# Patient Record
Sex: Female | Born: 1995
Health system: Southern US, Community
[De-identification: ages and names within clinical notes are randomized; demographics above are authoritative.]

## PROBLEM LIST (undated history)

## (undated) ENCOUNTER — Inpatient Hospital Stay (HOSPITAL_COMMUNITY): Payer: Self-pay

## (undated) DIAGNOSIS — Z789 Other specified health status: Secondary | ICD-10-CM

## (undated) HISTORY — PX: HERNIA REPAIR: SHX51

## (undated) HISTORY — PX: APPENDECTOMY: SHX54

---

## 2018-10-28 ENCOUNTER — Inpatient Hospital Stay (HOSPITAL_COMMUNITY)
Admission: AD | Admit: 2018-10-28 | Discharge: 2018-10-29 | Disposition: A | Discharge status: Home / Self Care | Payer: 59 | Source: Ambulatory Visit | Attending: Obstetrics and Gynecology | Admitting: Obstetrics and Gynecology

## 2018-10-28 ENCOUNTER — Encounter (HOSPITAL_COMMUNITY): Payer: Self-pay

## 2018-10-28 DIAGNOSIS — O4691 Antepartum hemorrhage, unspecified, first trimester: Secondary | ICD-10-CM | POA: Diagnosis not present

## 2018-10-28 DIAGNOSIS — O200 Threatened abortion: Secondary | ICD-10-CM | POA: Diagnosis not present

## 2018-10-28 DIAGNOSIS — Z3A01 Less than 8 weeks gestation of pregnancy: Secondary | ICD-10-CM | POA: Diagnosis not present

## 2018-10-28 DIAGNOSIS — O209 Hemorrhage in early pregnancy, unspecified: Secondary | ICD-10-CM | POA: Diagnosis present

## 2018-10-28 DIAGNOSIS — O4690 Antepartum hemorrhage, unspecified, unspecified trimester: Secondary | ICD-10-CM

## 2018-10-28 HISTORY — DX: Other specified health status: Z78.9

## 2018-10-28 LAB — ABO/RH: ABO/RH(D): O POS

## 2018-10-28 LAB — CBC
HCT: 39.5 % (ref 36.0–46.0)
Hemoglobin: 13.1 g/dL (ref 12.0–15.0)
MCH: 28.7 pg (ref 26.0–34.0)
MCHC: 33.2 g/dL (ref 30.0–36.0)
MCV: 86.4 fL (ref 80.0–100.0)
Platelets: 218 10*3/uL (ref 150–400)
RBC: 4.57 MIL/uL (ref 3.87–5.11)
RDW: 11.7 % (ref 11.5–15.5)
WBC: 7 10*3/uL (ref 4.0–10.5)
nRBC: 0 % (ref 0.0–0.2)

## 2018-10-28 NOTE — MAU Provider Note (Signed)
History     CSN: [id number]  Arrival date and time: 10/28/18 2259   First Provider Initiated Contact with Patient 10/28/18 2341      Chief Complaint  Patient presents with  . Vaginal Bleeding   Whitney Wood is a 23 y.o. G2P1 at [redacted]w[redacted]d by LMP who presents to MAU with complaints of vaginal bleeding. She reports vaginal bleeding started occurring this morning, started off as light pink spotting when she wipes then over the course of today turned into heavier bright red vaginal bleeding. She reports having to wear a panty liner for vaginal bleeding but it not being heavy enough to wear a pad. She denies abdominal pain/cramping, denies recent IC.    OB History    Gravida  2   Para  1   Term  1   Preterm  0   AB  0   Living  1     SAB  0   TAB  0   Ectopic  0   Multiple  0   Live Births  1           Past Medical History:  Diagnosis Date  . Medical history non-contributory     Past Surgical History:  Procedure Laterality Date  . APPENDECTOMY    . HERNIA REPAIR      Family History  Problem Relation Age of Onset  . Hypothyroidism Mother     Social History   Tobacco Use  . Smoking status: Never Smoker  . Smokeless tobacco: Never Used  Substance Use Topics  . Alcohol use: Never    Frequency: Never  . Drug use: Never    Allergies: No Known Allergies  No medications prior to admission.    Review of Systems  Constitutional: Negative.   Respiratory: Negative.   Cardiovascular: Negative.   Gastrointestinal: Negative.   Genitourinary: Positive for vaginal bleeding. Negative for difficulty urinating, dysuria, frequency, pelvic pain and urgency.  Musculoskeletal: Negative.    Physical Exam   Patient Vitals for the past 24 hrs:  BP Temp Temp src Pulse Resp SpO2 Height Weight  10/29/18 0105 126/70 97.8 F (36.6 C) Oral 92 18 - - -  10/28/18 2309 132/76 98.3 F (36.8 C) Oral 96 16 98 % 5\' 5"  (1.651 m) 75.3 kg    Physical Exam  Nursing note  and vitals reviewed. Constitutional: She is oriented to person, place, and time. She appears well-developed and well-nourished. No distress.  Cardiovascular: Normal rate, regular rhythm and normal heart sounds.  Respiratory: Effort normal and breath sounds normal. No respiratory distress. She has no wheezes.  GI: Soft. Bowel sounds are normal. She exhibits no distension. There is no abdominal tenderness. There is no rebound and no guarding.  Genitourinary:    Vaginal bleeding present.  There is bleeding in the vagina.    Genitourinary Comments: Pelvic exam: Cervix pink, visually closed, without lesion, moderate amount of bright red vaginal bleeding without clots (3 faux swabs), vaginal walls and external genitalia normal Bimanual exam: Cervix 0/long/high, firm, anterior, neg CMT, uterus nontender, nonenlarged, adnexa without tenderness, enlargement, or mass   Musculoskeletal: Normal range of motion.        General: No edema.  Neurological: She is alert and oriented to person, place, and time.  Psychiatric: She has a normal mood and affect. Her behavior is normal. Thought content normal.    MAU Course  Procedures  MDM Orders Placed This Encounter  Procedures  . Wet prep, genital  . US  OB LESS THAN 14 WEEKS WITH OB TRANSVAGINAL  . Urinalysis, Routine w reflex microscopic  . CBC  . hCG, quantitative, pregnancy  . ABO/Rh   Labs and [place] report reviewed:  Results for orders placed or performed during the hospital encounter of 10/28/18 (from the past 48 hour(s))  Urinalysis, Routine w reflex microscopic     Status: Abnormal   Collection Time: 10/28/18 11:07 PM  Result Value Ref Range   Color, Urine YELLOW YELLOW   APPearance HAZY (A) CLEAR   Specific Gravity, Urine 1.018 1.005 - 1.030   pH 7.0 5.0 - 8.0   Glucose, UA 50 (A) NEGATIVE mg/dL   Hgb urine dipstick LARGE (A) NEGATIVE   Bilirubin Urine NEGATIVE NEGATIVE   Ketones, ur NEGATIVE NEGATIVE mg/dL   Protein, ur NEGATIVE NEGATIVE  mg/dL   Nitrite NEGATIVE NEGATIVE   Leukocytes,Ua NEGATIVE NEGATIVE   RBC / HPF >50 (H) 0 - 5 RBC/hpf   WBC, UA 0-5 0 - 5 WBC/hpf   Bacteria, UA NONE SEEN NONE SEEN   Mucus PRESENT     Comment: Performed at [facility], [address], [place], [place] [address]  Wet prep, genital     Status: Abnormal   Collection Time: 10/28/18 11:25 PM  Result Value Ref Range   Yeast Wet Prep HPF POC NONE SEEN NONE SEEN   Trich, Wet Prep NONE SEEN NONE SEEN   Clue Cells Wet Prep HPF POC PRESENT (A) NONE SEEN   WBC, Wet Prep HPF POC FEW (A) NONE SEEN   Sperm NONE SEEN     Comment: Performed at [facility], [address], [place], [place] [address]  CBC     Status: None   Collection Time: 10/28/18 11:28 PM  Result Value Ref Range   WBC 7.0 4.0 - 10.5 K/uL   RBC 4.57 3.87 - 5.11 MIL/uL   Hemoglobin 13.1 12.0 - 15.0 g/dL   HCT 09.8 11.9 - 14.7 %   MCV 86.4 80.0 - 100.0 fL   MCH 28.7 26.0 - 34.0 pg   MCHC 33.2 30.0 - 36.0 g/dL   RDW 82.9 56.2 - 13.0 %   Platelets 218 150 - 400 K/uL   nRBC 0.0 0.0 - 0.2 %    Comment: Performed at [facility], [address], [place], [place] [address]  ABO/Rh     Status: None   Collection Time: 10/28/18 11:28 PM  Result Value Ref Range   ABO/RH(D) O POS    No rh immune globuloin      NOT A RH IMMUNE GLOBULIN CANDIDATE, PT RH POSITIVE Performed at [facility], [address] [place], [place] [address]   hCG, quantitative, pregnancy     Status: Abnormal   Collection Time: 10/28/18 11:28 PM  Result Value Ref Range   hCG, Beta Chain, Quant, S 344 (H) <5 mIU/mL    Comment:          GEST. AGE      CONC.  (mIU/mL)   <=1 WEEK        5 - 50     2 WEEKS       50 - 500     3 WEEKS       100 - 10,000     4 WEEKS     1,000 - 30,000     5 WEEKS     3,500 - 115,000   6-8 WEEKS     12,000 -  270,000    12 WEEKS     15,000 - 220,000        FEMALE AND NON-PREGNANT FEMALE:     LESS THAN 5 mIU/mL Performed at [facility], [address] [place], [place] [address]    US Ob Less Than 14 Weeks With Ob Transvaginal  Result Date: 10/29/2018 CLINICAL DATA:  Bleeding. Estimated gestational age by LMP is 5 weeks 6 days. Quantitative beta HCG is 344. EXAM: OBSTETRIC <14 WK [place] AND TRANSVAGINAL OB US TECHNIQUE: Both transabdominal and transvaginal ultrasound examinations were performed for complete evaluation of the gestation as well as the maternal uterus, adnexal regions, and pelvic cul-de-sac. Transvaginal technique was performed to assess early pregnancy. COMPARISON:  None. FINDINGS: Intrauterine gestational sac: A tiny circumscribed intrauterine fluid collection is present probably representing an early gestational sac. Yolk sac:  Not Visualized. Embryo:  Not Visualized. Cardiac Activity: Not Visualized. MSD: 1.9 mm, too small to calculate gestational age. Subchorionic hemorrhage:  None visualized. Maternal uterus/adnexae: Uterus is anteverted. No myometrial mass lesions identified. Cervix is unremarkable. Both ovaries are identified and appear normal. No abnormal adnexal masses or fluid collections. IMPRESSION: Probable early intrauterine gestational sac, but no yolk sac, fetal pole, or cardiac activity yet visualized. Recommend follow-up quantitative B-HCG levels and follow-up US in 14 days to assess viability. This recommendation follows SRU consensus guidelines: Diagnostic Criteria for Nonviable Pregnancy Early in the First Trimester. Malva Limes Med 2013; 539:7673-41. Electronically Signed   By: Burman Nieves M.D.   On: 10/29/2018 00:41   Discussed results with patient. Educated on possible SAB, discussed importance of patient following up in the office for repeat HCG in 48 hours and repeat US in 14 days for viability if HCG doubles in 48 hours. Educated on outcome if HCG drops over the next 48 hours. Answered patients questions and discussed reasons to present back to MAU prior to follow up. Pt stable at time of discharge. Patient  encouraged to call office in the morning and discuss MAU visit.  Assessment and Plan   1. Threatened miscarriage in early pregnancy   2. Vaginal bleeding during pregnancy   3. [redacted] weeks gestation of pregnancy    Discharge home Follow up in the office on Wednesday for repeat HCG  Return to MAU as needed for increased vaginal bleeding, lightheadedness or dizziness   Follow-up Information    [facility] [employer]. Schedule an appointment as soon as possible for a visit on 10/31/2018.   Why:  Make appointment on to be seen on Wednesday for repeat pregnancy hormone level  Contact information: [address]  [address] [place] [address] [phone]           Sharyon Cable CNM 10/29/2018, 1:53 AM

## 2018-10-28 NOTE — MAU Note (Signed)
Pt reports she had several +upts. States this morning she had some spotting and as the day has continued the bleeding has gotten heavier and bright red. Reports mild cramping. LMP: 09/17/2018

## 2018-10-29 ENCOUNTER — Inpatient Hospital Stay (HOSPITAL_COMMUNITY): Payer: 59

## 2018-10-29 DIAGNOSIS — Z3A01 Less than 8 weeks gestation of pregnancy: Secondary | ICD-10-CM

## 2018-10-29 DIAGNOSIS — O4691 Antepartum hemorrhage, unspecified, first trimester: Secondary | ICD-10-CM

## 2018-10-29 DIAGNOSIS — O200 Threatened abortion: Secondary | ICD-10-CM

## 2018-10-29 LAB — URINALYSIS, ROUTINE W REFLEX MICROSCOPIC
Bacteria, UA: NONE SEEN
Bilirubin Urine: NEGATIVE
Glucose, UA: 50 mg/dL — AB
Ketones, ur: NEGATIVE mg/dL
Leukocytes,Ua: NEGATIVE
Nitrite: NEGATIVE
Protein, ur: NEGATIVE mg/dL
RBC / HPF: >50 RBC/hpf — ABNORMAL HIGH (ref 0–5)
Specific Gravity, Urine: 1.018 (ref 1.005–1.030)
pH: 7 (ref 5.0–8.0)

## 2018-10-29 LAB — POCT PREGNANCY, URINE: Preg Test, Ur: POSITIVE — AB

## 2018-10-29 LAB — WET PREP, GENITAL
Sperm: NONE SEEN
Trich, Wet Prep: NONE SEEN
Yeast Wet Prep HPF POC: NONE SEEN

## 2018-10-29 LAB — HCG, QUANTITATIVE, PREGNANCY: hCG, Beta Chain, Quant, S: 344 m[IU]/mL — ABNORMAL HIGH (ref <5)

## 2018-10-29 NOTE — Discharge Instructions (Signed)
Threatened Miscarriage    A threatened miscarriage is when you have bleeding from your vagina during the first 20 weeks of pregnancy but the pregnancy does not end. Your doctor will do tests to make sure you are still pregnant. The cause of the bleeding may not be known. This condition does not mean your pregnancy will end, but it does increase the risk that it will end (complete miscarriage).  Follow these instructions at home:  · Get plenty of rest.  · If you have bleeding in your vagina, do not have sex or use tampons.  · Do not douche.  · Do not smoke or use drugs.  · Do not drink alcohol.  · Avoid caffeine.  · Keep all follow-up prenatal visits as told by your doctor. This is important.  Contact a doctor if:  · You have light bleeding from your vagina.  · You have belly pain or cramping.  · You have a fever.  Get help right away if:  · You have heavy bleeding from your vagina.  · You have clots of blood coming from your vagina.  · You pass tissue from your vagina.  · You have a gush of fluid from your vagina.  · You are leaking fluid from your vagina.  · You have very bad pain or cramps in your low back or belly.  · You have fever, chills, and very bad belly pain.  Summary  · A threatened miscarriage is when you have bleeding from your vagina during the first 20 weeks of pregnancy but the pregnancy does not end.  · This condition does not mean your pregnancy will end, but it does increase the risk that it will end (complete miscarriage).  · Get plenty of rest. If you have bleeding in your vagina, do not have sex or use tampons.  · Keep all follow-up prenatal visits as told by your doctor. This is important.  This information is not intended to replace advice given to you by your health care provider. Make sure you discuss any questions you have with your health care provider.  Document Released: 08/04/2008 Document Revised: 11/18/2016 Document Reviewed: 11/18/2016  Elsevier Interactive Patient Education © 2019  Elsevier Inc.

## 2018-10-30 LAB — GC/CHLAMYDIA PROBE AMP (~~LOC~~) NOT AT ARMC
Chlamydia: NEGATIVE
Neisseria Gonorrhea: NEGATIVE

## 2019-03-25 LAB — OB RESULTS CONSOLE GC/CHLAMYDIA
Chlamydia: NEGATIVE
Gonorrhea: NEGATIVE

## 2019-03-25 LAB — OB RESULTS CONSOLE HEPATITIS B SURFACE ANTIGEN: Hepatitis B Surface Ag: NEGATIVE

## 2019-03-25 LAB — OB RESULTS CONSOLE RPR: RPR: NONREACTIVE

## 2019-03-25 LAB — OB RESULTS CONSOLE RUBELLA ANTIBODY, IGM: Rubella: IMMUNE

## 2019-03-25 LAB — OB RESULTS CONSOLE VARICELLA ZOSTER ANTIBODY, IGG: Varicella: IMMUNE

## 2019-03-25 LAB — OB RESULTS CONSOLE HIV ANTIBODY (ROUTINE TESTING): HIV: NONREACTIVE

## 2019-08-08 LAB — OB RESULTS CONSOLE RPR: RPR: NONREACTIVE

## 2019-08-08 LAB — OB RESULTS CONSOLE HIV ANTIBODY (ROUTINE TESTING): HIV: NONREACTIVE

## 2019-09-06 NOTE — L&D Delivery Note (Addendum)
DELIVERY NOTE  Pt complete and at +2 station with urge to push. Epidural controlling pain. Pt pushed and delivered a non-viable female infant in LOA position. Anterior and posterior shoulders spontaneously delivered with next two pushes; body easily followed next. Cord was then clamped and cut by MD. 3VC. No spontaneous cry nor tone noted. Placenta then delivered at 1259 intact. No evidence of clot adherent to placenta, placenta to pathology. Fundal massage performed and pitocin per protocol. Fundus firm. The following lacerations were noted: NONE. Mother understandably upset but stable. Asks for daughter to be placed on warmer.Counts correct   Infant time: 1255 Gender: female Placenta time: 1259 Apgars: 0/0  Overall normal anatomy, no obvious cord accident on examination. ANora desired   UPDAET 1431: During baby bath as requested per patient, evidence of body cord wound very tightly around abdomen in counter-clockwise fashion leaving white mark. Again, otherwise, anatomy WNL. Anora sample sent. Molds made for parents, should be ready in AM.  Weight 6lb10oz

## 2019-10-02 ENCOUNTER — Other Ambulatory Visit: Payer: Self-pay

## 2019-10-02 ENCOUNTER — Inpatient Hospital Stay (HOSPITAL_COMMUNITY): Payer: 59

## 2019-10-02 ENCOUNTER — Inpatient Hospital Stay (HOSPITAL_COMMUNITY)
Admission: AD | Admit: 2019-10-02 | Discharge: 2019-10-03 | DRG: 807 | Disposition: A | Discharge status: Home / Self Care | Payer: 59 | Attending: Obstetrics and Gynecology | Admitting: Obstetrics and Gynecology

## 2019-10-02 ENCOUNTER — Encounter (HOSPITAL_COMMUNITY): Payer: Self-pay | Admitting: Obstetrics and Gynecology

## 2019-10-02 DIAGNOSIS — Z20822 Contact with and (suspected) exposure to covid-19: Secondary | ICD-10-CM | POA: Diagnosis present

## 2019-10-02 DIAGNOSIS — O364XX0 Maternal care for intrauterine death, not applicable or unspecified: Secondary | ICD-10-CM | POA: Diagnosis present

## 2019-10-02 DIAGNOSIS — O364XX1 Maternal care for intrauterine death, fetus 1: Secondary | ICD-10-CM | POA: Diagnosis not present

## 2019-10-02 DIAGNOSIS — Z3A35 35 weeks gestation of pregnancy: Secondary | ICD-10-CM | POA: Diagnosis not present

## 2019-10-02 LAB — CBC
HCT: 36 % (ref 36.0–46.0)
Hemoglobin: 11.7 g/dL — ABNORMAL LOW (ref 12.0–15.0)
MCH: 27.4 pg (ref 26.0–34.0)
MCHC: 32.5 g/dL (ref 30.0–36.0)
MCV: 84.3 fL (ref 80.0–100.0)
Platelets: 149 10*3/uL — ABNORMAL LOW (ref 150–400)
RBC: 4.27 MIL/uL (ref 3.87–5.11)
RDW: 12.5 % (ref 11.5–15.5)
WBC: 9 10*3/uL (ref 4.0–10.5)
nRBC: 0 % (ref 0.0–0.2)

## 2019-10-02 LAB — SARS CORONAVIRUS 2 (TAT 6-24 HRS): SARS Coronavirus 2: NEGATIVE

## 2019-10-02 LAB — TYPE AND SCREEN
ABO/RH(D): O POS
Antibody Screen: NEGATIVE

## 2019-10-02 MED ORDER — OXYCODONE-ACETAMINOPHEN 5-325 MG PO TABS: 2.0000 | ORAL_TABLET | ORAL | Status: DC | PRN

## 2019-10-02 MED ORDER — ONDANSETRON HCL 4 MG/2ML IJ SOLN
4.0000 mg | Freq: Four times a day (QID) | INTRAMUSCULAR | Status: DC | PRN
  Administered 2019-10-03: 4 mg via INTRAVENOUS
  Filled 2019-10-02: qty 2

## 2019-10-02 MED ORDER — LACTATED RINGERS IV SOLN: 500.0000 mL | Freq: Once | INTRAVENOUS | Status: DC

## 2019-10-02 MED ORDER — MISOPROSTOL 200 MCG PO TABS
400.0000 ug | ORAL_TABLET | Freq: Four times a day (QID) | ORAL | Status: DC
  Administered 2019-10-03 (×2): 400 ug via VAGINAL
  Filled 2019-10-02 (×2): qty 2

## 2019-10-02 MED ORDER — SODIUM CHLORIDE 0.9% FLUSH: 3.0000 mL | INTRAVENOUS | Status: DC | PRN

## 2019-10-02 MED ORDER — OXYTOCIN 40 UNITS IN NORMAL SALINE INFUSION - SIMPLE MED
2.5000 [IU]/h | INTRAVENOUS | Status: DC
  Filled 2019-10-02: qty 1000

## 2019-10-02 MED ORDER — DIPHENHYDRAMINE HCL 50 MG/ML IJ SOLN: 12.5000 mg | INTRAMUSCULAR | Status: DC | PRN

## 2019-10-02 MED ORDER — FENTANYL-BUPIVACAINE-NACL 0.5-0.125-0.9 MG/250ML-% EP SOLN
12.0000 mL/h | EPIDURAL | Status: DC | PRN
  Filled 2019-10-02: qty 250

## 2019-10-02 MED ORDER — SODIUM CHLORIDE 0.9 % IV SOLN: 250.0000 mL | INTRAVENOUS | Status: DC | PRN

## 2019-10-02 MED ORDER — LIDOCAINE HCL (PF) 1 % IJ SOLN: 30.0000 mL | INTRAMUSCULAR | Status: DC | PRN

## 2019-10-02 MED ORDER — LACTATED RINGERS IV SOLN: 500.0000 mL | INTRAVENOUS | Status: DC | PRN

## 2019-10-02 MED ORDER — PHENYLEPHRINE 40 MCG/ML (10ML) SYRINGE FOR IV PUSH (FOR BLOOD PRESSURE SUPPORT)
80.0000 ug | PREFILLED_SYRINGE | INTRAVENOUS | Status: DC | PRN
  Filled 2019-10-02: qty 10

## 2019-10-02 MED ORDER — OXYCODONE-ACETAMINOPHEN 5-325 MG PO TABS: 1.0000 | ORAL_TABLET | ORAL | Status: DC | PRN

## 2019-10-02 MED ORDER — SODIUM CHLORIDE 0.9% FLUSH: 3.0000 mL | Freq: Two times a day (BID) | INTRAVENOUS | Status: DC

## 2019-10-02 MED ORDER — EPHEDRINE 5 MG/ML INJ: 10.0000 mg | INTRAVENOUS | Status: DC | PRN

## 2019-10-02 MED ORDER — MISOPROSTOL 200 MCG PO TABS
ORAL_TABLET | ORAL | Status: AC
  Administered 2019-10-02: 400 ug via VAGINAL
  Filled 2019-10-02: qty 2

## 2019-10-02 MED ORDER — PHENYLEPHRINE 40 MCG/ML (10ML) SYRINGE FOR IV PUSH (FOR BLOOD PRESSURE SUPPORT): 80.0000 ug | PREFILLED_SYRINGE | INTRAVENOUS | Status: DC | PRN

## 2019-10-02 MED ORDER — SOD CITRATE-CITRIC ACID 500-334 MG/5ML PO SOLN: 30.0000 mL | ORAL | Status: DC | PRN

## 2019-10-02 MED ORDER — OXYTOCIN BOLUS FROM INFUSION: 500.0000 mL | Freq: Once | INTRAVENOUS | Status: DC

## 2019-10-02 MED ORDER — BUTORPHANOL TARTRATE 1 MG/ML IJ SOLN
1.0000 mg | INTRAMUSCULAR | Status: DC | PRN
  Administered 2019-10-02: 1 mg via INTRAVENOUS
  Filled 2019-10-02: qty 1

## 2019-10-02 MED ORDER — ACETAMINOPHEN 325 MG PO TABS
650.0000 mg | ORAL_TABLET | ORAL | Status: AC | PRN
  Administered 2019-10-02 – 2019-10-03 (×3): 650 mg via ORAL
  Filled 2019-10-02 (×3): qty 2

## 2019-10-02 NOTE — Progress Notes (Addendum)
Coping ok, questions answered VE-1/50/-3, vtx, cytotec in posterior fornix, intracervical foley placed Discussed possible causes of fetal demise.  Will get K-b stain now, will decide on further testing after delivery.  Discussed sending tissue for chromosomes-she declined genetic screening in early pregnancy, placental pathology, fetal autopsy-they are considering.  Might also consider antiphospholipid testing if no obvious cause.  Continue cytotec 400 mcg q 6 hrs for now

## 2019-10-02 NOTE — H&P (Signed)
Whitney Wood is a 24 y.o. female, G4, P69, EGA 35+ weeks with EDC 3-1 presenting for fetal demise.  Prenatal care uncomplicated to this point.  She called the office today to say she could not remember the last time the baby removed.  Unable to find FHT, fetal demise confirmed by u/s.  OB History    Gravida  4   Para  1   Term  1   Preterm  0   AB  2   Living  1     SAB  2   TAB  0   Ectopic  0   Multiple  0   Live Births  1          Past Medical History:  Diagnosis Date  . Medical history non-contributory    Past Surgical History:  Procedure Laterality Date  . APPENDECTOMY    . HERNIA REPAIR     Family History: family history includes Hypothyroidism in her mother. Social History:  reports that she has never smoked. She has never used smokeless tobacco. She reports that she does not drink alcohol or use drugs.   Review of Systems  Respiratory: Negative.   Cardiovascular: Negative.    Maternal Medical History:  Fetal activity: Perceived fetal activity is none.    Prenatal Complications - Diabetes: none.    Dilation: 1 Effacement (%): Thick Station: Ballotable Exam by:: Lorn Junes, RN Blood pressure 117/81, pulse (!) 101, temperature (!) 97.5 F (36.4 C), temperature source Axillary, resp. rate 16, height 5\' 6"  (1.676 m), weight 86.6 kg, unknown if currently breastfeeding. Maternal Exam:  Abdomen: Patient reports no abdominal tenderness. Fetal presentation: vertex  Introitus: Amniotic fluid character: not assessed.  Pelvis: adequate for delivery.      Physical Exam  Vitals reviewed. Constitutional: She appears well-developed and well-nourished.  Cardiovascular: Normal rate and regular rhythm.  Respiratory: Effort normal. No respiratory distress.  GI: Soft.    Prenatal labs: ABO, Rh: --/--/PENDING (01/27 1800) Antibody: PENDING (01/27 1800) Rubella: Immune (07/20 0000) RPR: Nonreactive (12/03 0000)  HBsAg: Negative (07/20 0000)  HIV:  Non-reactive (12/03 0000)   Assessment/Plan: Fetal demise at 35 weeks, confirmed by u/s in the office and hospital.  Will induce with cytotec   06-29-2000 Juanetta Negash 10/02/2019, 6:57 PM

## 2019-10-03 ENCOUNTER — Inpatient Hospital Stay (HOSPITAL_COMMUNITY): Payer: 59 | Admitting: Anesthesiology

## 2019-10-03 ENCOUNTER — Encounter (HOSPITAL_COMMUNITY): Payer: Self-pay | Admitting: Obstetrics and Gynecology

## 2019-10-03 LAB — KLEIHAUER-BETKE STAIN
# Vials RhIg: 1
Fetal Cells %: 0 %
Quantitation Fetal Hemoglobin: 0 mL

## 2019-10-03 MED ORDER — IBUPROFEN 800 MG PO TABS: 800.0000 mg | ORAL_TABLET | Freq: Three times a day (TID) | ORAL | 1 refills | Status: DC

## 2019-10-03 MED ORDER — LIDOCAINE-EPINEPHRINE (PF) 2 %-1:200000 IJ SOLN
INTRAMUSCULAR | Status: DC | PRN
  Administered 2019-10-03 (×2): 2 mL via EPIDURAL

## 2019-10-03 MED ORDER — SODIUM CHLORIDE (PF) 0.9 % IJ SOLN
INTRAMUSCULAR | Status: DC | PRN
  Administered 2019-10-03: 12 mL/h via EPIDURAL

## 2019-10-03 NOTE — Progress Notes (Signed)
Patient is now s/p epidural, comfortable. Contractions palpate q2-76m, moderate. Pt desires nap at this time. 3rd dose cytotec placed 0719. Available for questions as needed

## 2019-10-03 NOTE — Progress Notes (Signed)
I offered grief support both before and after delivery and provided them with resources for ongoing support, including [facility] for their son, Creta Levin.  They have good family support and many who love Maryella Shivers along with them.    Chaplain Dyanne Carrel, Bcc Pager, [phone] 5:58 PM    10/03/19 1700  Clinical Encounter Type  Visited With Patient and family together  Visit Type Spiritual support  Spiritual Encounters  Spiritual Needs Emotional;Grief support  Stress Factors  Patient Stress Factors Loss

## 2019-10-03 NOTE — Progress Notes (Signed)
Labor Note  S: s/p AORM [id number], brown color per RN. No pressure at this time  O: BP 117/75   Pulse 100   Temp 100.1 F (37.8 C) (Oral)   Resp 14   Ht 5\' 6"  (1.676 m)   Wt 86.6 kg   SpO2 99%   BMI 30.83 kg/m  CE: forebag palpated. AROM, initially small dark red blood clot extruded followed by copious clear fluid. 7/75/-1  A/P: This is a 24 y.o. 25 at [redacted]w[redacted]d  admitted w/ IUFD.   Will allow for pt contractions to continue w/o augmentation at this time. Patient would like to see baby girl after deliveyr but would prefer her to be wrapped.   Of note, increased temp likely 2/2 PV cytotec, pt denies feeling feverish/chills. Continue PO tylenol PRN and monitor.

## 2019-10-03 NOTE — Anesthesia Procedure Notes (Signed)
Epidural Patient location during procedure: OB Start time: 10/03/2019 5:40 AM End time: 10/03/2019 5:55 AM  Staffing Anesthesiologist: Elmer Picker, MD Performed: anesthesiologist   Preanesthetic Checklist Completed: patient identified, IV checked, risks and benefits discussed, monitors and equipment checked, pre-op evaluation and timeout performed  Epidural Patient position: sitting Prep: DuraPrep and site prepped and draped Patient monitoring: continuous pulse ox, blood pressure, heart rate and cardiac monitor Approach: midline Location: L3-L4 Injection technique: LOR air  Needle:  Needle type: Tuohy  Needle gauge: 17 G Needle length: 9 cm Needle insertion depth: 5 cm Catheter type: closed end flexible Catheter size: 19 Gauge Catheter at skin depth: 11 cm Test dose: negative  Assessment Sensory level: T8 Events: blood not aspirated, injection not painful, no injection resistance, no paresthesia and negative IV test  Additional Notes Patient identified. Risks/Benefits/Options discussed with patient including but not limited to bleeding, infection, nerve damage, paralysis, failed block, incomplete pain control, headache, blood pressure changes, nausea, vomiting, reactions to medication both or allergic, itching and postpartum back pain. Confirmed with bedside nurse the patient's most recent platelet count. Confirmed with patient that they are not currently taking any anticoagulation, have any bleeding history or any family history of bleeding disorders. Patient expressed understanding and wished to proceed. All questions were answered. Sterile technique was used throughout the entire procedure. Please see nursing notes for vital signs. Test dose was given through epidural catheter and negative prior to continuing to dose epidural or start infusion. Warning signs of high block given to the patient including shortness of breath, tingling/numbness in hands, complete motor block,  or any concerning symptoms with instructions to call for help. Patient was given instructions on fall risk and not to get out of bed. All questions and concerns addressed with instructions to call with any issues or inadequate analgesia.  Reason for block:procedure for pain

## 2019-10-03 NOTE — Progress Notes (Signed)
Patient is doing well at this time. Found funeral home in [place]). Declining autopsy at this time, holding baby. Patient has been able to ambulate, tolerate PO and void without any issues since epidural. VSS, no longer with any temperature elevations (liekly 2/2 PV cytotec). Patient is requesting early discharge given recent delivery of IUFD. Bleeding and infectious precautions given to patient. Patient understands strict adherence to these. All questions answered.

## 2019-10-03 NOTE — Anesthesia Preprocedure Evaluation (Addendum)
Anesthesia Evaluation  Patient identified by MRN, date of birth, ID band Patient awake    Reviewed: Allergy & Precautions, NPO status , Patient's Chart, lab work & pertinent test results  Airway Mallampati: II  TM Distance: >3 FB Neck ROM: Full    Dental no notable dental hx.    Pulmonary neg pulmonary ROS,    Pulmonary exam normal breath sounds clear to auscultation       Cardiovascular negative cardio ROS Normal cardiovascular exam Rhythm:Regular Rate:Normal     Neuro/Psych negative neurological ROS  negative psych ROS   GI/Hepatic negative GI ROS, Neg liver ROS,   Endo/Other  negative endocrine ROS  Renal/GU negative Renal ROS  negative genitourinary   Musculoskeletal negative musculoskeletal ROS (+)   Abdominal   Peds  Hematology negative hematology ROS (+)   Anesthesia Other Findings IUFD  Reproductive/Obstetrics                             Anesthesia Physical Anesthesia Plan  ASA: II  Anesthesia Plan: Epidural   Post-op Pain Management:    Induction:   PONV Risk Score and Plan: Treatment may vary due to age or medical condition  Airway Management Planned: Natural Airway  Additional Equipment:   Intra-op Plan:   Post-operative Plan:   Informed Consent: I have reviewed the patients History and Physical, chart, labs and discussed the procedure including the risks, benefits and alternatives for the proposed anesthesia with the patient or authorized representative who has indicated his/her understanding and acceptance.       Plan Discussed with: Anesthesiologist  Anesthesia Plan Comments: (Patient identified. Risks, benefits, options discussed with patient including but not limited to bleeding, infection, nerve damage, paralysis, failed block, incomplete pain control, headache, blood pressure changes, nausea, vomiting, reactions to medication, itching, and post partum  back pain. Confirmed with bedside nurse the patient's most recent platelet count. Confirmed with the patient that they are not taking any anticoagulation, have any bleeding history or any family history of bleeding disorders. Patient expressed understanding and wishes to proceed. All questions were answered. )        Anesthesia Quick Evaluation

## 2019-10-03 NOTE — Discharge Instructions (Addendum)
Fetal Demise Fetal demise, also called intrauterine fetal demise (IUFD) or fetal death, is the loss of a baby after 20 weeks of pregnancy. When this occurs, the unborn baby (fetus) does not show any signs of life, such as a heartbeat or breathing. Most women recognize the loss of the baby soon after it happens, due to the lack of fetal movement. Some women experience spontaneous labor shortly after fetal demise resulting in a stillborn birth (stillbirth). What are the causes? The cause is often unknown. Sometimes fetal demise may be caused by:  A problem with the umbilical cord or placenta.  A birth defect or genetic disorder in the baby.  A serious infection during pregnancy.  The baby not growing enough in the womb (poor fetal growth).  Long-term health problems in the mother, such as diabetes, lupus, kidney disease, thyroid disease, or gallbladder disease.  High blood pressure (hypertension) or a related disorder such as preeclampsia. What increases the risk? You may have a higher risk of fetal demise if you:  Use tobacco, drugs, or alcohol during pregnancy.  Are pregnant with two or more babies.  Are older than age 16.  Are of African-American descent.  Are pregnant for the first time.  Are obese. What are the signs or symptoms? When you experience fetal demise, your abdomen will stop getting bigger and you will not feel your baby move. Fetal demise increases your risk of certain complications, such as:  Severe bleeding due to problems with blood clotting (disseminated intravascular coagulation).  Infection in the uterus (intrauterine infection).  Increased bleeding from the uterus. How is this diagnosed? A physical exam may show signs of fetal demise. The diagnosis must be confirmed with an ultrasound to check for:  The lack of a heartbeat.  The lack of fetal movement. How is this treated? Treatment for fetal demise may include:  Waiting for your body's  natural process of labor to begin (expectant management). You will be monitored closely during this time for signs of infection or other problems. You may have the option to return home with instructions about what to expect as your body continues to naturally release your baby and placenta. If labor does not start on its own, you will need a labor induction.  Using medicines to start your labor (labor induction). If labor has not started, you may choose to have your labor induced with medicines and be monitored in the hospital setting. This will cause you to have contractions in order to deliver your baby and placenta.  Doing surgery to deliver your baby and placenta through an incision in your abdomen and your uterus (cesarean delivery, or C-section). After delivery:  Parents are encouraged to see and hold their baby. This may help you with the grieving process.  You may consider creating keepsakes, such as taking a photo or getting an imprint of your baby's footprint or handprint.  Request to have any religious or cultural ceremonies that you prefer. Work with your health care team to make arrangements for the handling of your baby's remains.  Your baby may be examined to determine if problems were present that could happen again in a future pregnancy.  You may be given antibiotic medicine to treat infection if you lost your baby because of an infection.  If you have Rh-negative blood, you may be given a injection of Rho(D) immune globulin to help prevent problems with future pregnancies. Follow these instructions at home: Medicines  Take over-the-counter and prescription medicines only  told by your health care provider.  If you were prescribed an antibiotic, take it as told by your health care provider. Do not stop taking the antibiotic even if you start to feel better. General instructions  Your health care provider may recommend working with a trained grief counselor who can help you work  through your emotions. It may help to work with this counselor before and after delivery.  It may be helpful to meet with others who have experienced fetal demise. Ask your health care provider about support groups and resources.  Talk with your health care provider about any activity restrictions, including sexual activity.  Use a sanitary napkin for bleeding and discharge from your vagina. Do not douche or use tampons until your health care provider says that this is safe.  Keep all follow-up visits as told by your health care provider. This is important.  When you are ready, meet with your health care provider to discuss steps to take for a future pregnancy. Contact a health care provider if you:  Continue to experience grief, sadness, or lack of motivation for everyday activities, and those feelings do not improve over time.  Have painful, hard, or reddened breasts.  Have not had a menstrual period by the 12th week after delivery. Get help right away if you have:  Heavy vaginal bleeding that soaks more than 1 regular sanitary pad in an hour.  Chest pain.  A fever or chills.  Shortness of breath.  Vaginal bleeding that continues for more than 6 weeks after delivery.  Pain while urinating.  Blood in your urine.  Bad-smelling vaginal discharge.  Abdominal pain.  Leg pain, swelling, or redness.  A severe headache.  Changes in your vision.  Feelings of sadness that take over your thoughts, or you have thoughts of hurting yourself. If you ever feel like you may hurt yourself or others, or have thoughts about taking your own life, get help right away. You can go to your nearest emergency department or call:  Your local emergency services (911 in the [place]  A suicide crisis helpline, such as the National Suicide Prevention Lifeline at 1-800-273-8255. This is open 24 hours a day. Summary  Fetal demise, also called intrauterine fetal demise (IUFD) or fetal death, is the  loss of a baby after 20 weeks of pregnancy.  You may be at higher risk for fetal demise if you use tobacco, drugs, or alcohol during pregnancy.  Treatment may include waiting for your body's natural process of labor to begin, using medicine to start labor (labor induction), or doing surgery to deliver your baby (cesarean delivery, or C-section).  Your health care provider may recommend working with a trained grief counselor who can help you work through your emotions. This information is not intended to replace advice given to you by your health care provider. Make sure you discuss any questions you have with your health care provider. Document Revised: 08/04/2017 Document Reviewed: 07/04/2017 Elsevier Patient Education  2020 [employer]    Managing Pregnancy Loss Pregnancy loss can happen any time during a pregnancy. Often the cause is not known. It is rarely because of anything you did. Pregnancy loss in early pregnancy (during the first trimester) is called a miscarriage. This type of pregnancy loss is the most common. Pregnancy loss that happens after 20 weeks of pregnancy is called fetal demise if the baby's heart stops beating before birth. Fetal demise is much less common. Some women experience spontaneous labor shortly after   resulting in a stillborn birth (stillbirth). Any pregnancy loss can be devastating. You will need to recover both physically and emotionally. Most women are able to get pregnant again after a pregnancy loss and deliver a healthy baby. How to manage emotional recovery  Pregnancy loss is very hard emotionally. You may feel many different emotions while you grieve. You may feel sad and angry. You may also feel guilty. It is normal to have periods of crying. Emotional recovery can take longer than physical recovery. It is different for everyone. Taking these steps can help you in managing this loss:  Remember that it is unlikely you did anything  to cause the pregnancy loss.  Share your thoughts and feelings with friends, family, and your partner. Remember that your partner is also recovering emotionally.  Make sure you have a good support system. Do not spend too much time alone.  Meet with a pregnancy loss counselor or join a pregnancy loss support group.  Get enough sleep and eat a healthy diet. Return to regular exercise when you have recovered physically.  Do not use drugs or alcohol to manage your emotions.  Consider seeing a mental health professional to help you recover emotionally.  Ask a friend or loved one to help you decide what to do with any clothing and nursery items you received for your baby. In the case of a stillbirth, many women benefit from taking additional steps in the grieving process. You may want to:  Hold your baby after the birth.  Name your baby.  Request a birth certificate.  Create a keepsake such as handprints or footprints.  Dress your baby and have a picture taken.  Make funeral arrangements.  Ask for a baptism or blessing. Hospitals have staff members who can help you with all these arrangements. How to recognize emotional stress It is normal to have emotional stress after a pregnancy loss. But emotional stress that lasts a long time or becomes severe requires treatment. Watch out for these signs of severe emotional stress:  Sadness, anger, or guilt that is not going away and is interfering with your normal activities.  Relationship problems that have occurred or gotten worse since the pregnancy loss.  Signs of depression that last longer than 2 weeks. These may include: ? Sadness. ? Anxiety. ? Hopelessness. ? Loss of interest in activities you enjoy. ? Inability to concentrate. ? Trouble sleeping or sleeping too much. ? Loss of appetite or overeating. ? Thoughts of death or of hurting yourself. Follow these instructions at home:  Take over-the-counter and prescription  medicines only as told by your health care provider.  Rest at home until your energy level returns. Return to your normal activities as told by your health care provider. Ask your health care provider what activities are safe for you.  When you are ready, meet with your health care provider to discuss steps to take for a future pregnancy.  Keep all follow-up visits as told by your health care provider. This is important. Where to find support  To help you and your partner with the process of grieving, talk with your health care provider or seek counseling.  Consider meeting with others who have experienced pregnancy loss. Ask your health care provider about support groups and resources. Where to find more information  [place] Department of Health and [employer]ist on Women's Health: http://hoffman.com/  American Pregnancy Association: www.americanpregnancy.org Contact a health care provider if:  You continue to experience grief, sadness, or lack  of motivation for everyday activities, and those feelings do not improve over time.  You are struggling to recover emotionally, especially if you are using alcohol or substances to help. Get help right away if:  You have thoughts of hurting yourself or others. If you ever feel like you may hurt yourself or others, or have thoughts about taking your own life, get help right away. You can go to your nearest emergency department or call:  Your local emergency services (911 in the [place]  A suicide crisis helpline, such as the [place] at (248)123-4754. This is open 24 hours a day. Summary  Any pregnancy loss can be difficult physically and emotionally.  You may experience many different emotions while you grieve. Emotional recovery can last longer than physical recovery.  It is normal to have emotional stress after a pregnancy loss. But emotional stress that lasts a long time or becomes severe requires  treatment.  See your health care provider if you are struggling emotionally after a pregnancy loss. This information is not intended to replace advice given to you by your health care provider. Make sure you discuss any questions you have with your health care provider. Document Revised: 12/12/2018 Document Reviewed: 11/02/2017 Elsevier Patient Education  2020 [employer].

## 2019-10-03 NOTE — Discharge Summary (Signed)
    OB Discharge Summary     Patient Name: Whitney Wood DOB: 02-Dec-1995 MRN: [id number]  Date of admission: 10/02/2019 Delivering MD: Ellison Hughs M   Date of discharge: 10/03/2019  Admitting diagnosis: Fetal demise, greater than 22 weeks, antepartum, fetus 1 [O36.4XX1] Intrauterine pregnancy: [redacted]w[redacted]d     Secondary diagnosis:  Active Problems:   Fetal demise, greater than 22 weeks, antepartum, fetus 1      Discharge diagnosis: IUFD                                                                                           Complications: [facility] Psychiatric course: Patient was admitted on 1/27 after being diagnosed with IUFD same day in clinic (initially presented for DFM, found to have absent cardiac activity). Admitted and given PV cytotec x3 for cervical ripening. Underwent AROM and shortly thereafter, delivered non-viable female infant at 18. No complications during delivery. Patient had good pain control and was able to ambulate and void several times prior to discharge on PPD#0. Patient was seen by chaplain services and elected for funeral home for baby girl, declined autopsy. Strict bleeding and infectious precautions were given to patient given early discharge after event.   Physical exam  Vitals:   10/03/19 1316 10/03/19 1445 10/03/19 1600 10/03/19 1700  BP: 117/63  110/62   Pulse: (!) 120  100   Resp: 14  12   Temp:  98.4 F (36.9 C) 98.7 F (37.1 C) 98.5 F (36.9 C)  TempSrc:  Oral Oral Oral  SpO2:      Weight:      Height:       General: alert, cooperative and no distress Lochia: appropriate Uterine Fundus: firm DVT Evaluation: No evidence of DVT seen on physical exam. Negative Homan's sign. No cords or calf tenderness. Labs: Lab Results  Component Value Date   WBC 9.0 10/02/2019   HGB 11.7 (L) 10/02/2019   HCT 36.0 10/02/2019   MCV 84.3 10/02/2019   PLT 149 (L) 10/02/2019   No flowsheet data found.  Discharge instruction: per After Visit  Summary and "Baby and Me Booklet".  After visit meds:  Allergies as of 10/03/2019   No Known Allergies     Medication List    STOP taking these medications   calcium carbonate 500 MG chewable tablet Commonly known as: TUMS - dosed in mg elemental calcium   prenatal multivitamin Tabs tablet     TAKE these medications   ibuprofen 800 MG tablet Commonly known as: ADVIL Take 1 tablet (800 mg total) by mouth every 8 (eight) hours.       Diet: routine diet  Activity: Advance as tolerated. Pelvic rest for 6 weeks.   Outpatient follow up:6 weeks Follow up Appt:No future appointments. Follow up Visit:No follow-ups on file.   Newborn Data: Stillborn female  Birth Weight: 6 lb 10 oz (3005 g) APGAR: 0, 0  Newborn Delivery   Birth date/time: 10/03/2019 12:55:00 Delivery type: Vaginal, Spontaneous     10/03/2019 Carlisle Cater, MD

## 2019-10-04 NOTE — Anesthesia Postprocedure Evaluation (Signed)
Anesthesia Post Note  Patient: Whitney Wood  Procedure(s) Performed: AN AD HOC LABOR EPIDURAL     Patient location during evaluation: Other (call to home. ) Anesthesia Type: Epidural Level of consciousness: awake and alert Pain management: pain level controlled Vital Signs Assessment: post-procedure vital signs reviewed and stable Respiratory status: spontaneous breathing, nonlabored ventilation and respiratory function stable Cardiovascular status: stable Postop Assessment: no headache, no backache and epidural receding Anesthetic complications: no    Last Vitals:  Vitals:   10/03/19 1600 10/03/19 1700  BP: 110/62   Pulse: 100   Resp: 12   Temp: 37.1 C 36.9 C  SpO2:      Last Pain:  Vitals:   10/03/19 1700  TempSrc: Oral  PainSc: 0-No pain   Pain Goal:                   Phi Avans

## 2019-10-07 LAB — SURGICAL PATHOLOGY

## 2020-02-27 IMAGING — US US OB < 14 WEEKS - US OB TV
1 series · 15 of 28 positions shown · non-contrast
Comparison: None.

CLINICAL DATA: Bleeding. Estimated gestational age by LMP is 5
weeks 6 days. Quantitative beta HCG is 344.

EXAM:
OBSTETRIC <14 WK US AND TRANSVAGINAL OB US
TECHNIQUE: Both transabdominal and transvaginal ultrasound examinations were
performed for complete evaluation of the gestation as well as the
maternal uterus, adnexal regions, and pelvic cul-de-sac.
Transvaginal technique was performed to assess early pregnancy.

[Series 1: us ob < 14 weeks - us ob tv · 38 acquisitions, 15 frames shown]
[im 1/38]
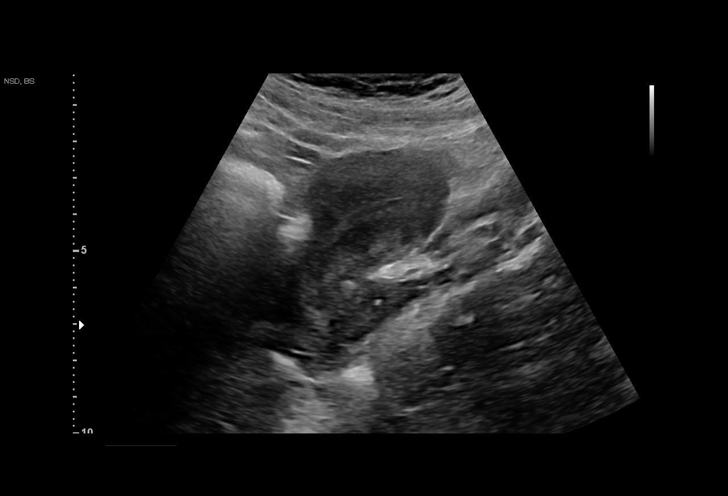
[im 3/38]
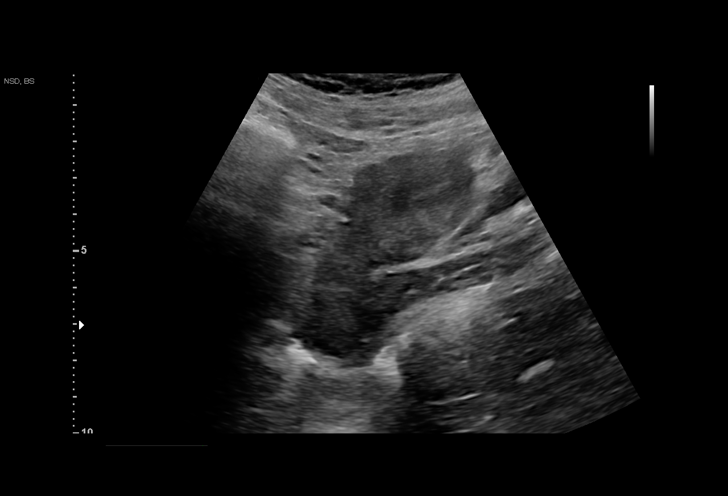
[im 6/38]
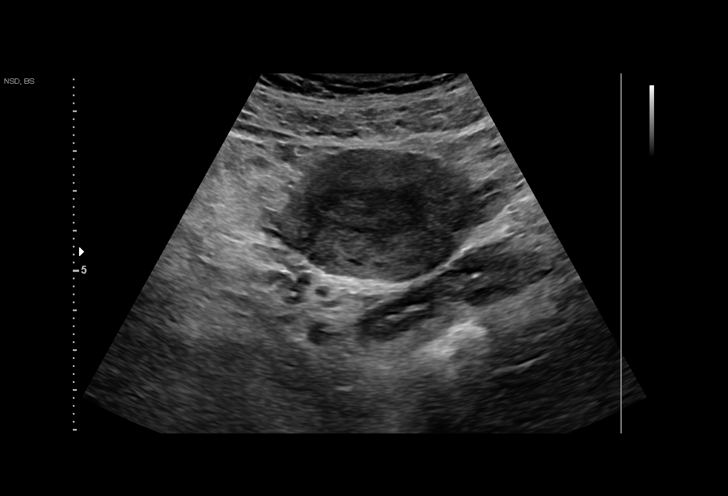
[im 9/38]
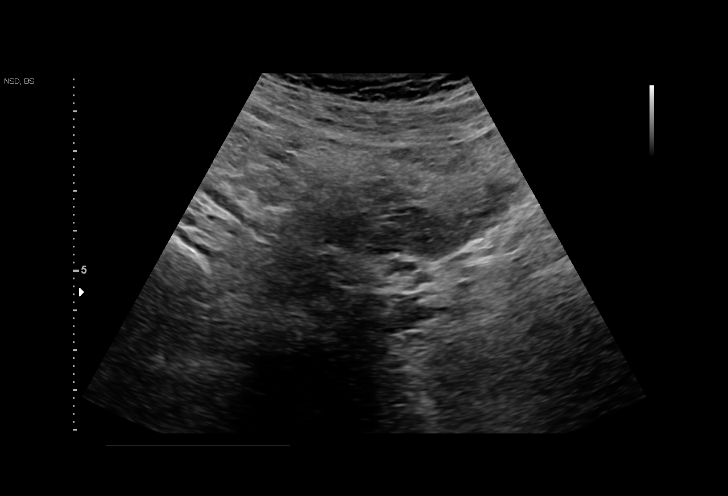
[im 11/38]
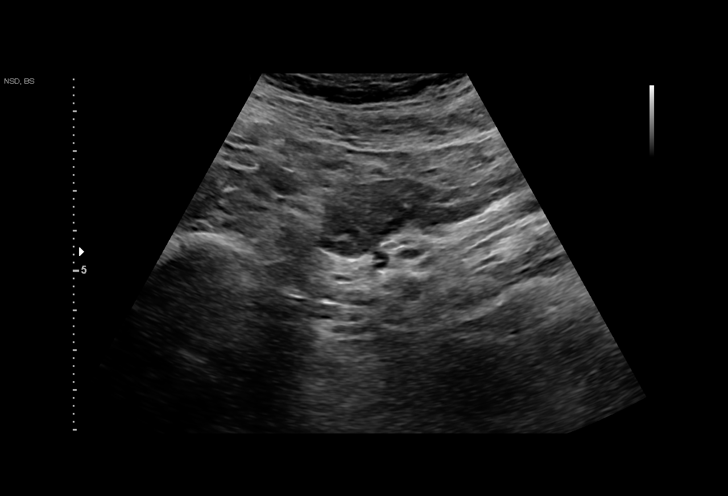
[im 14/38]
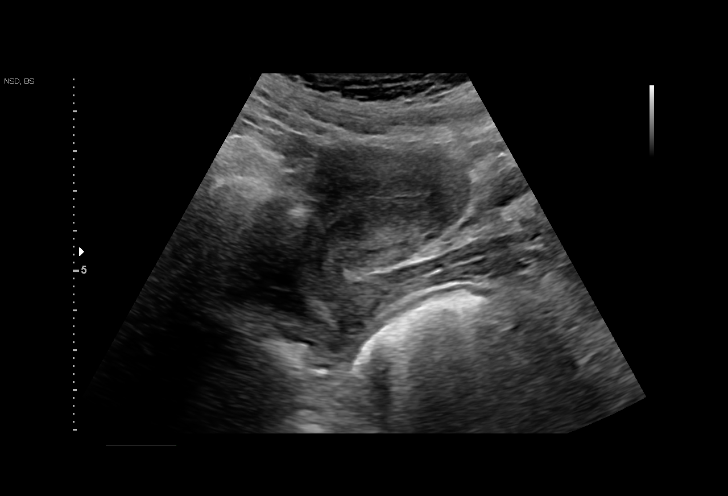
[im 17/38]
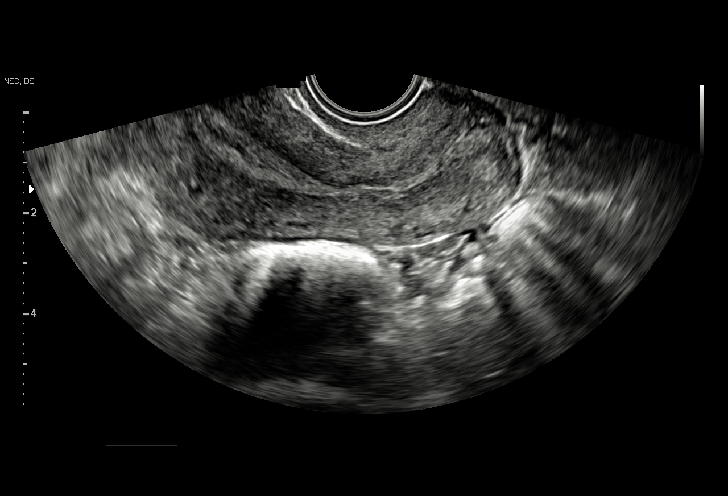
[im 20/38]
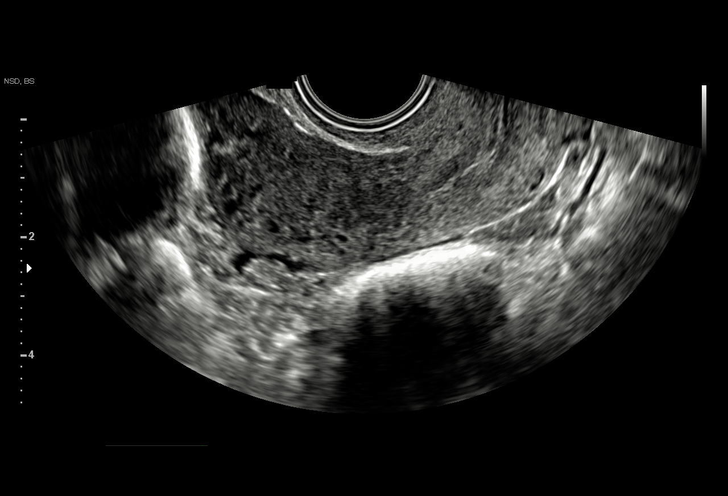
[im 21/38]
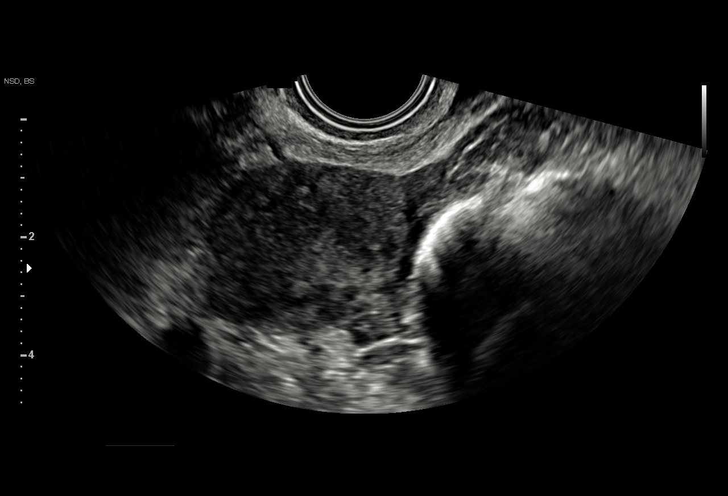
[im 24/38]
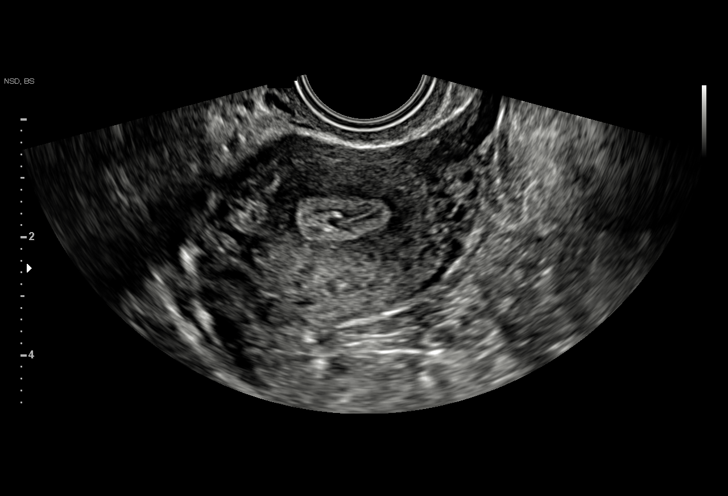
[im 27/38]
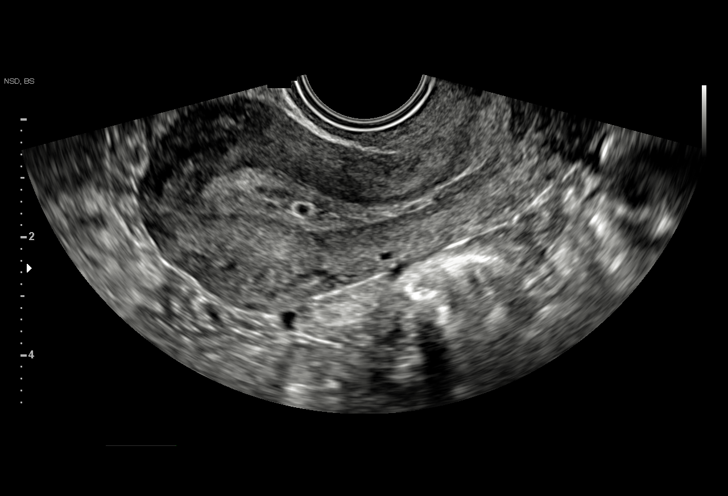
[im 29/38]
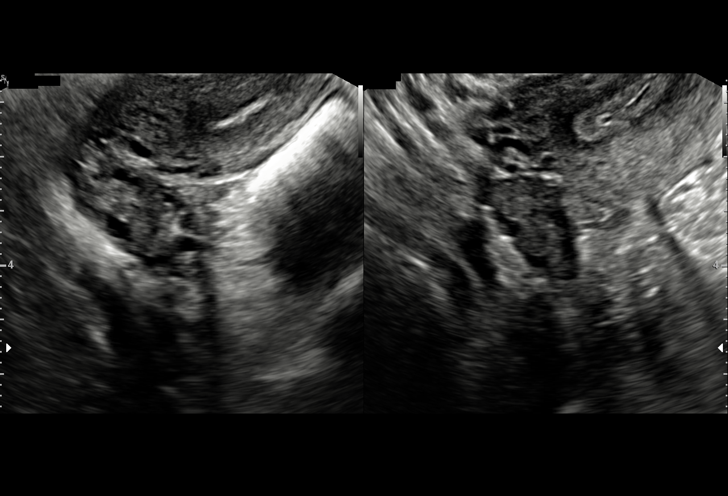
[im 32/38]
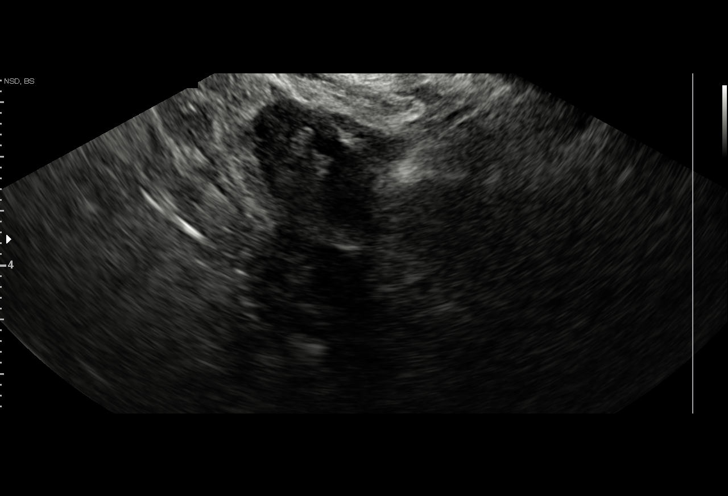
[im 35/38]
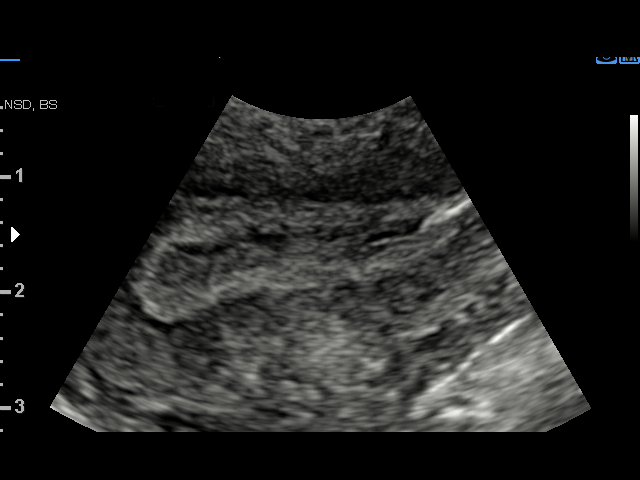
[im 38/38]
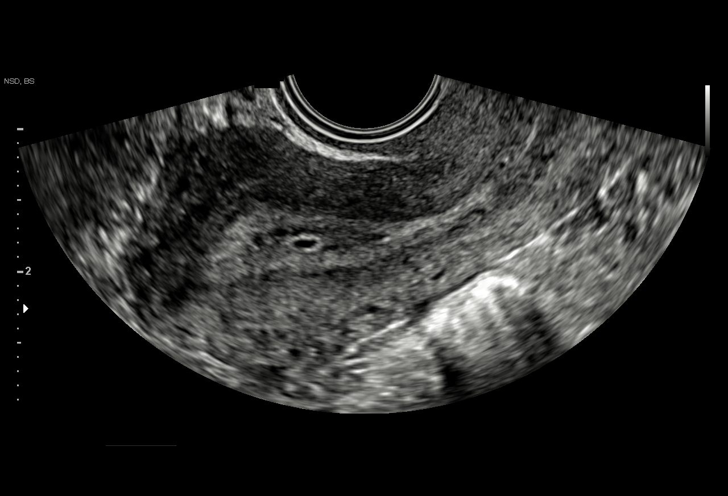

[15 of 28 positions shown; findings below may reference images not displayed]

FINDINGS: Intrauterine gestational sac: A tiny circumscribed intrauterine
fluid collection is present probably representing an early
gestational sac.

Yolk sac:  Not Visualized.

Embryo:  Not Visualized.

Cardiac Activity: Not Visualized.

MSD: 1.9 mm, too small to calculate gestational age.

Subchorionic hemorrhage:  None visualized.

Maternal uterus/adnexae: Uterus is anteverted. No myometrial mass
lesions identified. Cervix is unremarkable. Both ovaries are
identified and appear normal. No abnormal adnexal masses or fluid
collections.
IMPRESSION: Probable early intrauterine gestational sac, but no yolk sac, fetal
pole, or cardiac activity yet visualized. Recommend follow-up
quantitative B-HCG levels and follow-up US in 14 days to assess
viability. This recommendation follows SRU consensus guidelines:
Diagnostic Criteria for Nonviable Pregnancy Early in the First
Trimester. N Engl J Med 6031; [DATE].

## 2020-09-05 NOTE — L&D Delivery Note (Signed)
DELIVERY NOTE  Pt complete and at +2 station with urge to push. Epidural controlling pain. Pt pushed and delivered a viable female infant in ROA position. Anterior and posterior shoulders spontaneously delivered with next two pushes; body easily followed next. Infant placed on mothers abdomen and bulb suction of mouth and nose performed. Cord was then clamped and cut by FOB. Cord blood obtained, 3VC. Baby had a vigorous spontaneous cry noted. Placenta then delivered at 1754 intact. Fundal massage performed and pitocin per protocol. Fundus firm. The following lacerations were noted: none. Mother and baby stable. Counts correct. EBL 200cc  Infant time: 74 Gender: female Placenta time: 1754 Apgars: 9/9 Weight: pending skin-to-skin

## 2020-09-14 ENCOUNTER — Other Ambulatory Visit: Payer: Self-pay

## 2020-09-14 ENCOUNTER — Encounter (HOSPITAL_COMMUNITY): Payer: Self-pay | Admitting: Obstetrics and Gynecology

## 2020-09-14 ENCOUNTER — Inpatient Hospital Stay (HOSPITAL_COMMUNITY)
Admission: AD | Admit: 2020-09-14 | Discharge: 2020-09-15 | Disposition: A | Discharge status: Home / Self Care | Payer: 59 | Attending: Obstetrics and Gynecology | Admitting: Obstetrics and Gynecology

## 2020-09-14 DIAGNOSIS — Z3A36 36 weeks gestation of pregnancy: Secondary | ICD-10-CM | POA: Insufficient documentation

## 2020-09-14 DIAGNOSIS — J0100 Acute maxillary sinusitis, unspecified: Secondary | ICD-10-CM | POA: Diagnosis not present

## 2020-09-14 DIAGNOSIS — O99513 Diseases of the respiratory system complicating pregnancy, third trimester: Secondary | ICD-10-CM | POA: Insufficient documentation

## 2020-09-14 DIAGNOSIS — K0889 Other specified disorders of teeth and supporting structures: Secondary | ICD-10-CM | POA: Diagnosis not present

## 2020-09-14 DIAGNOSIS — O99613 Diseases of the digestive system complicating pregnancy, third trimester: Secondary | ICD-10-CM | POA: Diagnosis not present

## 2020-09-14 DIAGNOSIS — R519 Headache, unspecified: Secondary | ICD-10-CM | POA: Diagnosis present

## 2020-09-14 DIAGNOSIS — Z3689 Encounter for other specified antenatal screening: Secondary | ICD-10-CM

## 2020-09-14 DIAGNOSIS — Z20822 Contact with and (suspected) exposure to covid-19: Secondary | ICD-10-CM | POA: Insufficient documentation

## 2020-09-14 LAB — CBC WITH DIFFERENTIAL/PLATELET
Abs Immature Granulocytes: 0.09 10*3/uL — ABNORMAL HIGH (ref 0.00–0.07)
Basophils Absolute: 0 10*3/uL (ref 0.0–0.1)
Basophils Relative: 0 %
Eosinophils Absolute: 0 10*3/uL (ref 0.0–0.5)
Eosinophils Relative: 0 %
HCT: 31.9 % — ABNORMAL LOW (ref 36.0–46.0)
Hemoglobin: 10 g/dL — ABNORMAL LOW (ref 12.0–15.0)
Immature Granulocytes: 1 %
Lymphocytes Relative: 10 %
Lymphs Abs: 1.2 10*3/uL (ref 0.7–4.0)
MCH: 24.5 pg — ABNORMAL LOW (ref 26.0–34.0)
MCHC: 31.3 g/dL (ref 30.0–36.0)
MCV: 78.2 fL — ABNORMAL LOW (ref 80.0–100.0)
Monocytes Absolute: 0.7 10*3/uL (ref 0.1–1.0)
Monocytes Relative: 6 %
Neutro Abs: 9.4 10*3/uL — ABNORMAL HIGH (ref 1.7–7.7)
Neutrophils Relative %: 83 %
Platelets: 170 10*3/uL (ref 150–400)
RBC: 4.08 MIL/uL (ref 3.87–5.11)
RDW: 14 % (ref 11.5–15.5)
WBC: 11.4 10*3/uL — ABNORMAL HIGH (ref 4.0–10.5)
nRBC: 0 % (ref 0.0–0.2)

## 2020-09-14 MED ORDER — SODIUM CHLORIDE 0.9 % IV SOLN: Freq: Once | INTRAVENOUS | Status: AC

## 2020-09-14 MED ORDER — DEXAMETHASONE SODIUM PHOSPHATE 10 MG/ML IJ SOLN
10.0000 mg | Freq: Once | INTRAMUSCULAR | Status: AC
  Administered 2020-09-14: 10 mg via INTRAVENOUS
  Filled 2020-09-14: qty 1

## 2020-09-14 MED ORDER — METOCLOPRAMIDE HCL 5 MG/ML IJ SOLN
10.0000 mg | Freq: Once | INTRAMUSCULAR | Status: AC
  Administered 2020-09-14: 10 mg via INTRAVENOUS
  Filled 2020-09-14: qty 2

## 2020-09-14 MED ORDER — DIPHENHYDRAMINE HCL 50 MG/ML IJ SOLN
25.0000 mg | Freq: Once | INTRAMUSCULAR | Status: AC
  Administered 2020-09-14: 25 mg via INTRAVENOUS
  Filled 2020-09-14: qty 1

## 2020-09-14 NOTE — MAU Provider Note (Signed)
Chief Complaint:  Emesis and Headache   Event Date/Time   First Provider Initiated Contact with Patient 09/14/20 2316     HPI: Whitney Wood is a 25 y.o. [id number] at [redacted]w[redacted]d who presents to maternity admissions reporting severe headache, nausea, vomiting, and diarrhea. She is being treated for a sinus infection but has only taken one dose of antibiotics. This illness began on 09/06/19 with "sinus infection symptoms" including sinus congestion with cough and headache, then began having nausea, vomiting and diarrhea two days ago. Today she is presenting with a severe "migraine" that she feels in her left temple radiating down causing tooth pain. She reports seeing black spots yesterday but not today. She denies epigastric pain. She has taken two home Covid tests, both were negative. She has not been vaccinated against Covid or flu. Denies vaginal bleeding, leaking of fluid, decreased fetal movement, fever or falls.  Past Medical History:  Diagnosis Date  . Medical history non-contributory    OB History  Gravida Para Term Preterm AB Living  5 2 1 1 2 1   SAB IAB Ectopic Multiple Live Births  2 0 0 0 1    # Outcome Date GA Lbr Len/2nd Weight Sex Delivery Anes PTL Lv  5 Current           4 Preterm 10/03/19 [redacted]w[redacted]d 07:46 / 00:09 6 lb 10 oz (3.005 kg) F Vag-Spont EPI  FD  3 SAB 01/14/19 [redacted]w[redacted]d         2 SAB 10/28/18 [redacted]w[redacted]d         1 Term      Vag-Spont   LIV   Past Surgical History:  Procedure Laterality Date  . APPENDECTOMY    . HERNIA REPAIR     Family History  Problem Relation Age of Onset  . Hypothyroidism Mother    Social History   Tobacco Use  . Smoking status: Never Smoker  . Smokeless tobacco: Never Used  Substance Use Topics  . Alcohol use: Never  . Drug use: Never   No Known Allergies Medications Prior to Admission  Medication Sig Dispense Refill Last Dose  . acetaminophen (TYLENOL) 500 MG tablet Take 1,000 mg by mouth every 6 (six) hours as needed.   09/14/2020 at 1200  .  ibuprofen (ADVIL) 800 MG tablet Take 1 tablet (800 mg total) by mouth every 8 (eight) hours. 60 tablet 1     I have reviewed patient's Past Medical Hx, Surgical Hx, Family Hx, Social Hx, medications and allergies.   ROS:  Review of Systems  Constitutional: Positive for fatigue.  HENT: Positive for congestion and dental problem (pain in upper ridge of teeth on the left side).   Eyes: Positive for visual disturbance (yesterday).  Gastrointestinal: Positive for diarrhea, nausea and vomiting.  Genitourinary: Negative for vaginal bleeding.  Neurological: Positive for headaches.  All other systems reviewed and are negative.   Physical Exam   Patient Vitals for the past 24 hrs:  BP Temp Temp src Pulse Resp SpO2  09/15/20 0141 120/72 - - 99 - -  09/15/20 0115 102/65 - - 90 - 99 %  09/15/20 0100 102/68 - - 85 - 98 %  09/15/20 0045 (!) 95/52 - - 84 - 97 %  09/15/20 0030 109/66 - - 78 - 99 %  09/15/20 0015 115/70 - - 89 - -  09/15/20 0011 113/70 - - 74 - -  09/14/20 2301 124/70 98.4 F (36.9 C) Oral - 19 100 %   Constitutional: Well-developed,  well-nourished female in mild distress.  Cardiovascular: normal rate & rhythm, no murmur Respiratory: normal effort, lung sounds clear throughout GI: Abd soft, non-tender, gravid appropriate for gestational age. Pos BS x 4 MS: Extremities nontender, no edema, normal ROM Neurologic: Alert and oriented x 4.  GU: no CVA tenderness      Pelvic exam deferred    Fetal Tracing: reactive Baseline: 130 Variability: moderate Accelerations: present Decelerations: none Toco: irregular, q10+min   Labs: Results for orders placed or performed during the hospital encounter of 09/14/20 (from the past 24 hour(s))  CBC with Differential/Platelet     Status: Abnormal   Collection Time: 09/14/20 10:56 PM  Result Value Ref Range   WBC 11.4 (H) 4.0 - 10.5 K/uL   RBC 4.08 3.87 - 5.11 MIL/uL   Hemoglobin 10.0 (L) 12.0 - 15.0 g/dL   HCT 21.3 (L) 08.6 - 57.8 %    MCV 78.2 (L) 80.0 - 100.0 fL   MCH 24.5 (L) 26.0 - 34.0 pg   MCHC 31.3 30.0 - 36.0 g/dL   RDW 46.9 62.9 - 52.8 %   Platelets 170 150 - 400 K/uL   nRBC 0.0 0.0 - 0.2 %   Neutrophils Relative % 83 %   Neutro Abs 9.4 (H) 1.7 - 7.7 K/uL   Lymphocytes Relative 10 %   Lymphs Abs 1.2 0.7 - 4.0 K/uL   Monocytes Relative 6 %   Monocytes Absolute 0.7 0.1 - 1.0 K/uL   Eosinophils Relative 0 %   Eosinophils Absolute 0.0 0.0 - 0.5 K/uL   Basophils Relative 0 %   Basophils Absolute 0.0 0.0 - 0.1 K/uL   Immature Granulocytes 1 %   Abs Immature Granulocytes 0.09 (H) 0.00 - 0.07 K/uL  Comprehensive metabolic panel     Status: Abnormal   Collection Time: 09/14/20 10:56 PM  Result Value Ref Range   Sodium 132 (L) 135 - 145 mmol/L   Potassium 3.5 3.5 - 5.1 mmol/L   Chloride 103 98 - 111 mmol/L   CO2 17 (L) 22 - 32 mmol/L   Glucose, Bld 80 70 - 99 mg/dL   BUN <5 (L) 6 - 20 mg/dL   Creatinine, Ser 4.13 (L) 0.44 - 1.00 mg/dL   Calcium 8.4 (L) 8.9 - 10.3 mg/dL   Total Protein 6.3 (L) 6.5 - 8.1 g/dL   Albumin 2.7 (L) 3.5 - 5.0 g/dL   AST 14 (L) 15 - 41 U/L   ALT 11 0 - 44 U/L   Alkaline Phosphatase 95 38 - 126 U/L   Total Bilirubin 0.7 0.3 - 1.2 mg/dL   GFR, Estimated >24 >40 mL/min   Anion gap 12 5 - 15  Resp Panel by RT-PCR (Flu A&B, Covid) Nasopharyngeal Swab     Status: None   Collection Time: 09/14/20 11:51 PM   Specimen: Nasopharyngeal Swab; Nasopharyngeal(NP) swabs in vial transport medium  Result Value Ref Range   SARS Coronavirus 2 by RT PCR NEGATIVE NEGATIVE   Influenza A by PCR NEGATIVE NEGATIVE   Influenza B by PCR NEGATIVE NEGATIVE   Imaging:  No results found.  MAU Course: Orders Placed This Encounter  Procedures  . Resp Panel by RT-PCR (Flu A&B, Covid) Nasopharyngeal Swab  . CBC with Differential/Platelet  . Comprehensive metabolic panel  . Airborne and Contact precautions  . Insert peripheral IV  . Discharge patient   Meds ordered this encounter  Medications  .  AND Linked Order Group   . 0.9 %  sodium chloride  infusion   . diphenhydrAMINE (BENADRYL) injection 25 mg   . metoCLOPramide (REGLAN) injection 10 mg   . dexamethasone (DECADRON) injection 10 mg  . DISCONTD: ondansetron (ZOFRAN ODT) 8 MG disintegrating tablet    Sig: Take 1 tablet (8 mg total) by mouth every 8 (eight) hours as needed for nausea or vomiting.    Dispense:  20 tablet    Refill:  0    Order Specific Question:   Supervising Provider    Answer:   Reva Bores [[id number]]  . DISCONTD: fluticasone (FLONASE) 50 MCG/ACT nasal spray    Sig: Place 2 sprays into both nostrils in the morning and at bedtime for 7 days.    Dispense:  9.9 mL    Refill:  0    Order Specific Question:   Supervising Provider    Answer:   Reva Bores [[id number]]  . ondansetron (ZOFRAN ODT) 8 MG disintegrating tablet    Sig: Take 1 tablet (8 mg total) by mouth every 8 (eight) hours as needed for nausea or vomiting.    Dispense:  20 tablet    Refill:  0    Order Specific Question:   Supervising Provider    Answer:   Reva Bores [[id number]]  . fluticasone (FLONASE) 50 MCG/ACT nasal spray    Sig: Place 2 sprays into both nostrils in the morning and at bedtime for 7 days.    Dispense:  9.9 mL    Refill:  0    Order Specific Question:   Supervising Provider    Answer:   Samara Snide   MDM: Headache cocktail given with relief of 10+/10 to 4/10 - only residual dental pain remaining but pt declined additional medication  Preeclampsia labs normal  Negative for Covid and flu  Discussed management of sinus pain with pt and encouraged her to continue taking antibiotics, begin use of flonase, and use hypertonic nasal solution prn. Pt amenable to plan and requested antiemetic since the antibiotics make her nauseated. Scripts sent to new pharmacy in [place], [place].  Assessment: 1. Acute maxillary sinusitis, recurrence not specified   2. Sinus headache   3. Pain, dental   4. NST (non-stress test) reactive      Plan: Discharge home in stable condition.    Follow-up Information    [facility] Go to.   Why: as scheduled for ongoing prenatal care Contact information: [address]  [address] [place] [address] [phone]               Allergies as of 09/15/2020   No Known Allergies     Medication List    STOP taking these medications   ibuprofen 800 MG tablet Commonly known as: ADVIL     TAKE these medications   acetaminophen 500 MG tablet Commonly known as: TYLENOL Take 1,000 mg by mouth every 6 (six) hours as needed.   fluticasone 50 MCG/ACT nasal spray Commonly known as: Flonase Place 2 sprays into both nostrils in the morning and at bedtime for 7 days.   ondansetron 8 MG disintegrating tablet Commonly known as: Zofran ODT Take 1 tablet (8 mg total) by mouth every 8 (eight) hours as needed for nausea or vomiting.      Edd Arbour, CNM, MSN, [facility] 09/15/20 2:13 AM

## 2020-09-14 NOTE — MAU Note (Signed)
Pt presents to MAU for migraines and nausea, and emesis beginning beginning on 1/7.  Pt reports negative antigen COVID test on 1/6.  Pt denies LOF, vaginal bleeding.  Pt endorses FM.

## 2020-09-14 NOTE — MAU Provider Note (Incomplete)
Chief Complaint:  Emesis and Headache   Event Date/Time   First Provider Initiated Contact with Patient 09/14/20 2316     HPI: Whitney Wood is a 25 y.o. [id number] at [redacted]w[redacted]d who presents to maternity admissions reporting severe headache, nausea, vomiting, and diarrhea. She is being treated for a sinus infection but has only taken one dose of antibiotics. This illness began on 09/06/19 with "sinus infection symptoms" including sinus congestion, Denies vaginal bleeding, leaking of fluid, decreased fetal movement, fever, falls, or recent illness.     Pregnancy Course: ***  Past Medical History:  Diagnosis Date  . Medical history non-contributory    OB History  Gravida Para Term Preterm AB Living  5 2 1 1 2 1   SAB IAB Ectopic Multiple Live Births  2 0 0 0 1    # Outcome Date GA Lbr Len/2nd Weight Sex Delivery Anes PTL Lv  5 Current           4 Preterm 10/03/19 [redacted]w[redacted]d 07:46 / 00:09 6 lb 10 oz (3.005 kg) F Vag-Spont EPI  FD  3 SAB 01/14/19 [redacted]w[redacted]d         2 SAB 10/28/18 [redacted]w[redacted]d         1 Term      Vag-Spont   LIV   Past Surgical History:  Procedure Laterality Date  . APPENDECTOMY    . HERNIA REPAIR     Family History  Problem Relation Age of Onset  . Hypothyroidism Mother    Social History   Tobacco Use  . Smoking status: Never Smoker  . Smokeless tobacco: Never Used  Substance Use Topics  . Alcohol use: Never  . Drug use: Never   No Known Allergies Medications Prior to Admission  Medication Sig Dispense Refill Last Dose  . acetaminophen (TYLENOL) 500 MG tablet Take 1,000 mg by mouth every 6 (six) hours as needed.   09/14/2020 at 1200  . ibuprofen (ADVIL) 800 MG tablet Take 1 tablet (800 mg total) by mouth every 8 (eight) hours. 60 tablet 1     I have reviewed patient's Past Medical Hx, Surgical Hx, Family Hx, Social Hx, medications and allergies.   ROS:  Review of Systems  Physical Exam  No data found.  Constitutional: Well-developed, well-nourished female in no acute  distress.  Cardiovascular: normal rate & rhythm, no murmur Respiratory: normal effort, lung sounds clear throughout GI: Abd soft, non-tender, gravid appropriate for gestational age. Pos BS x 4 MS: Extremities nontender, no edema, normal ROM Neurologic: Alert and oriented x 4.  GU:      Pelvic: NEFG, physiologic discharge, no blood, cervix clean.      Fetal Tracing: Baseline: Variability: Accelerations:  Decelerations: Toco:    Labs: Results for orders placed or performed during the hospital encounter of 09/14/20 (from the past 24 hour(s))  CBC with Differential/Platelet     Status: Abnormal   Collection Time: 09/14/20 10:56 PM  Result Value Ref Range   WBC 11.4 (H) 4.0 - 10.5 K/uL   RBC 4.08 3.87 - 5.11 MIL/uL   Hemoglobin 10.0 (L) 12.0 - 15.0 g/dL   HCT 11/12/20 (L) 15.8 - 30.9 %   MCV 78.2 (L) 80.0 - 100.0 fL   MCH 24.5 (L) 26.0 - 34.0 pg   MCHC 31.3 30.0 - 36.0 g/dL   RDW 40.7 68.0 - 88.1 %   Platelets 170 150 - 400 K/uL   nRBC 0.0 0.0 - 0.2 %   Neutrophils Relative % 83 %  Neutro Abs 9.4 (H) 1.7 - 7.7 K/uL   Lymphocytes Relative 10 %   Lymphs Abs 1.2 0.7 - 4.0 K/uL   Monocytes Relative 6 %   Monocytes Absolute 0.7 0.1 - 1.0 K/uL   Eosinophils Relative 0 %   Eosinophils Absolute 0.0 0.0 - 0.5 K/uL   Basophils Relative 0 %   Basophils Absolute 0.0 0.0 - 0.1 K/uL   Immature Granulocytes 1 %   Abs Immature Granulocytes 0.09 (H) 0.00 - 0.07 K/uL    Imaging:  No results found.  MAU Course: Orders Placed This Encounter  Procedures  . Resp Panel by RT-PCR (Flu A&B, Covid) Nasopharyngeal Swab  . CBC with Differential/Platelet  . Comprehensive metabolic panel  . Airborne and Contact precautions  . Insert peripheral IV   Meds ordered this encounter  Medications  . AND Linked Order Group   . 0.9 %  sodium chloride infusion   . diphenhydrAMINE (BENADRYL) injection 25 mg   . metoCLOPramide (REGLAN) injection 10 mg   . dexamethasone (DECADRON) injection 10 mg     MDM:  Assessment: No diagnosis found.  Plan: Discharge home in stable condition.  ***    Allergies as of 09/14/2020   No Known Allergies   Med Rec must be completed prior to using this SMARTLINK***       .Kyung Bacca

## 2020-09-15 LAB — COMPREHENSIVE METABOLIC PANEL
ALT: 11 U/L (ref 0–44)
AST: 14 U/L — ABNORMAL LOW (ref 15–41)
Albumin: 2.7 g/dL — ABNORMAL LOW (ref 3.5–5.0)
Alkaline Phosphatase: 95 U/L (ref 38–126)
Anion gap: 12 (ref 5–15)
BUN: <5 mg/dL — ABNORMAL LOW (ref 6–20)
CO2: 17 mmol/L — ABNORMAL LOW (ref 22–32)
Calcium: 8.4 mg/dL — ABNORMAL LOW (ref 8.9–10.3)
Chloride: 103 mmol/L (ref 98–111)
Creatinine, Ser: 0.38 mg/dL — ABNORMAL LOW (ref 0.44–1.00)
GFR, Estimated: >60 mL/min (ref >60)
Glucose, Bld: 80 mg/dL (ref 70–99)
Potassium: 3.5 mmol/L (ref 3.5–5.1)
Sodium: 132 mmol/L — ABNORMAL LOW (ref 135–145)
Total Bilirubin: 0.7 mg/dL (ref 0.3–1.2)
Total Protein: 6.3 g/dL — ABNORMAL LOW (ref 6.5–8.1)

## 2020-09-15 LAB — RESP PANEL BY RT-PCR (FLU A&B, COVID) ARPGX2
Influenza A by PCR: NEGATIVE
Influenza B by PCR: NEGATIVE
SARS Coronavirus 2 by RT PCR: NEGATIVE

## 2020-09-15 MED ORDER — FLUTICASONE PROPIONATE 50 MCG/ACT NA SUSP: 2.0000 | Freq: Two times a day (BID) | NASAL | 0 refills | Status: AC

## 2020-09-15 MED ORDER — ONDANSETRON 8 MG PO TBDP: 8.0000 mg | ORAL_TABLET | Freq: Three times a day (TID) | ORAL | 0 refills | Status: DC | PRN

## 2020-09-15 MED ORDER — FLUTICASONE PROPIONATE 50 MCG/ACT NA SUSP: 2.0000 | Freq: Two times a day (BID) | NASAL | 0 refills | Status: DC

## 2020-09-15 NOTE — Discharge Instructions (Signed)
Sinus Headache  A sinus headache happens when your sinuses get swollen or blocked (clogged). Sinuses are spaces behind the bones of your face and forehead. You may feel pain or pressure in your face, forehead, ears, or upper teeth. Sinus headaches can be mild or very bad. Follow these instructions at home: General instructions  If told: ? Apply a warm, moist washcloth to your face. This can help to lessen pain. ? Use a nasal saline wash. Follow the directions on the bottle or box. Hydrate and humidify  Drink enough water to keep your pee (urine) pale yellow.  Use a cool mist humidifier to keep the humidity level in your home above 50%.  Breathe in steam for 10-15 minutes, 3-4 times a day or as told by your doctor. You can do this in the bathroom while a hot shower is running.  Try not to spend time in cool or dry air. Medicines  Take over-the-counter and prescription medicines only as told by your doctor.  If you were prescribed an antibiotic medicine, take it as told by your doctor. Do not stop taking it even if you start to feel better.  Use a nose spray if your nose feels full of mucus (congested).   Contact a doctor if:  You get more than one headache a week.  Light or sound bothers you.  You have a fever.  You feel sick to your stomach (nauseous) or you throw up (vomit).  Your headaches do not get better with treatment. Get help right away if:  You have trouble seeing.  You suddenly have very bad pain in your face or head.  You start to have quick, sudden movements or shaking that you cannot control (seizure).  You are confused.  You have a stiff neck. Summary  A sinus headache happens when your sinuses get swollen or blocked (clogged). Sinuses are spaces behind the bones of your face and forehead.  You may feel pain or pressure in your face, forehead, ears, or upper teeth.  Take over-the-counter and prescription medicines only as told by your doctor.  If  told, apply a warm, moist washcloth to your face. This can help to lessen pain. This information is not intended to replace advice given to you by your health care provider. Make sure you discuss any questions you have with your health care provider. Document Revised: 06/02/2020 Document Reviewed: 06/02/2020 Elsevier Patient Education  2021 [employer] Inc.  

## 2020-09-24 ENCOUNTER — Other Ambulatory Visit: Payer: Self-pay

## 2020-09-24 ENCOUNTER — Inpatient Hospital Stay (HOSPITAL_COMMUNITY)
Admission: AD | Admit: 2020-09-24 | Discharge: 2020-09-26 | DRG: 807 | Disposition: A | Discharge status: Home / Self Care | Payer: 59 | Attending: Obstetrics and Gynecology | Admitting: Obstetrics and Gynecology

## 2020-09-24 ENCOUNTER — Inpatient Hospital Stay (HOSPITAL_COMMUNITY): Payer: 59 | Admitting: Anesthesiology

## 2020-09-24 ENCOUNTER — Inpatient Hospital Stay (HOSPITAL_BASED_OUTPATIENT_CLINIC_OR_DEPARTMENT_OTHER): Payer: 59

## 2020-09-24 ENCOUNTER — Encounter (HOSPITAL_COMMUNITY): Payer: Self-pay | Admitting: Obstetrics and Gynecology

## 2020-09-24 DIAGNOSIS — Z20822 Contact with and (suspected) exposure to covid-19: Secondary | ICD-10-CM | POA: Diagnosis present

## 2020-09-24 DIAGNOSIS — O368130 Decreased fetal movements, third trimester, not applicable or unspecified: Principal | ICD-10-CM | POA: Diagnosis present

## 2020-09-24 DIAGNOSIS — O368190 Decreased fetal movements, unspecified trimester, not applicable or unspecified: Secondary | ICD-10-CM | POA: Diagnosis present

## 2020-09-24 DIAGNOSIS — O09293 Supervision of pregnancy with other poor reproductive or obstetric history, third trimester: Secondary | ICD-10-CM

## 2020-09-24 DIAGNOSIS — Z3A37 37 weeks gestation of pregnancy: Secondary | ICD-10-CM | POA: Diagnosis not present

## 2020-09-24 LAB — TYPE AND SCREEN
ABO/RH(D): O POS
Antibody Screen: NEGATIVE

## 2020-09-24 LAB — SARS CORONAVIRUS 2 BY RT PCR (HOSPITAL ORDER, PERFORMED IN ~~LOC~~ HOSPITAL LAB): SARS Coronavirus 2: NEGATIVE

## 2020-09-24 LAB — CBC
HCT: 31.4 % — ABNORMAL LOW (ref 36.0–46.0)
Hemoglobin: 10.3 g/dL — ABNORMAL LOW (ref 12.0–15.0)
MCH: 25.7 pg — ABNORMAL LOW (ref 26.0–34.0)
MCHC: 32.8 g/dL (ref 30.0–36.0)
MCV: 78.3 fL — ABNORMAL LOW (ref 80.0–100.0)
Platelets: 201 10*3/uL (ref 150–400)
RBC: 4.01 MIL/uL (ref 3.87–5.11)
RDW: 14.6 % (ref 11.5–15.5)
WBC: 8.9 10*3/uL (ref 4.0–10.5)
nRBC: 0 % (ref 0.0–0.2)

## 2020-09-24 MED ORDER — SODIUM CHLORIDE (PF) 0.9 % IJ SOLN
INTRAMUSCULAR | Status: DC | PRN
  Administered 2020-09-24: 12 mL/h via EPIDURAL

## 2020-09-24 MED ORDER — TETANUS-DIPHTH-ACELL PERTUSSIS 5-2.5-18.5 LF-MCG/0.5 IM SUSY: 0.5000 mL | PREFILLED_SYRINGE | Freq: Once | INTRAMUSCULAR | Status: DC

## 2020-09-24 MED ORDER — BUTORPHANOL TARTRATE 1 MG/ML IJ SOLN
1.0000 mg | INTRAMUSCULAR | Status: DC | PRN
Start: 2020-09-24 — End: 2020-09-24

## 2020-09-24 MED ORDER — ONDANSETRON HCL 4 MG/2ML IJ SOLN: 4.0000 mg | INTRAMUSCULAR | Status: DC | PRN

## 2020-09-24 MED ORDER — LACTATED RINGERS IV SOLN: 500.0000 mL | INTRAVENOUS | Status: DC | PRN

## 2020-09-24 MED ORDER — LIDOCAINE HCL (PF) 1 % IJ SOLN
INTRAMUSCULAR | Status: DC | PRN
  Administered 2020-09-24: 10 mL via EPIDURAL

## 2020-09-24 MED ORDER — LIDOCAINE HCL (PF) 1 % IJ SOLN: 30.0000 mL | INTRAMUSCULAR | Status: DC | PRN

## 2020-09-24 MED ORDER — FENTANYL-BUPIVACAINE-NACL 0.5-0.125-0.9 MG/250ML-% EP SOLN
12.0000 mL/h | EPIDURAL | Status: DC | PRN
  Filled 2020-09-24: qty 250

## 2020-09-24 MED ORDER — OXYTOCIN-SODIUM CHLORIDE 30-0.9 UT/500ML-% IV SOLN: 2.5000 [IU]/h | INTRAVENOUS | Status: DC

## 2020-09-24 MED ORDER — SENNOSIDES-DOCUSATE SODIUM 8.6-50 MG PO TABS
2.0000 | ORAL_TABLET | Freq: Every day | ORAL | Status: DC
  Administered 2020-09-25: 2 via ORAL
  Filled 2020-09-24: qty 2

## 2020-09-24 MED ORDER — OXYCODONE-ACETAMINOPHEN 5-325 MG PO TABS
2.0000 | ORAL_TABLET | ORAL | Status: DC | PRN
Start: 2020-09-24 — End: 2020-09-24

## 2020-09-24 MED ORDER — LACTATED RINGERS IV SOLN: INTRAVENOUS | Status: DC

## 2020-09-24 MED ORDER — PHENYLEPHRINE 40 MCG/ML (10ML) SYRINGE FOR IV PUSH (FOR BLOOD PRESSURE SUPPORT): 80.0000 ug | PREFILLED_SYRINGE | INTRAVENOUS | Status: DC | PRN

## 2020-09-24 MED ORDER — ACETAMINOPHEN 325 MG PO TABS: 650.0000 mg | ORAL_TABLET | ORAL | Status: DC | PRN

## 2020-09-24 MED ORDER — BENZOCAINE-MENTHOL 20-0.5 % EX AERO: 1.0000 "application " | INHALATION_SPRAY | CUTANEOUS | Status: DC | PRN

## 2020-09-24 MED ORDER — LACTATED RINGERS IV SOLN
500.0000 mL | Freq: Once | INTRAVENOUS | Status: AC
  Administered 2020-09-24: 500 mL via INTRAVENOUS

## 2020-09-24 MED ORDER — ONDANSETRON HCL 4 MG PO TABS: 4.0000 mg | ORAL_TABLET | ORAL | Status: DC | PRN

## 2020-09-24 MED ORDER — SIMETHICONE 80 MG PO CHEW: 80.0000 mg | CHEWABLE_TABLET | ORAL | Status: DC | PRN

## 2020-09-24 MED ORDER — WITCH HAZEL-GLYCERIN EX PADS: 1.0000 "application " | MEDICATED_PAD | CUTANEOUS | Status: DC | PRN

## 2020-09-24 MED ORDER — EPHEDRINE 5 MG/ML INJ: 10.0000 mg | INTRAVENOUS | Status: DC | PRN

## 2020-09-24 MED ORDER — ONDANSETRON HCL 4 MG/2ML IJ SOLN: 4.0000 mg | Freq: Four times a day (QID) | INTRAMUSCULAR | Status: DC | PRN

## 2020-09-24 MED ORDER — DIBUCAINE (PERIANAL) 1 % EX OINT: 1.0000 "application " | TOPICAL_OINTMENT | CUTANEOUS | Status: DC | PRN

## 2020-09-24 MED ORDER — OXYCODONE-ACETAMINOPHEN 5-325 MG PO TABS
1.0000 | ORAL_TABLET | ORAL | Status: DC | PRN
Start: 2020-09-24 — End: 2020-09-24

## 2020-09-24 MED ORDER — OXYTOCIN-SODIUM CHLORIDE 30-0.9 UT/500ML-% IV SOLN
1.0000 m[IU]/min | INTRAVENOUS | Status: DC
  Administered 2020-09-24: 2 m[IU]/min via INTRAVENOUS
  Filled 2020-09-24: qty 500

## 2020-09-24 MED ORDER — SOD CITRATE-CITRIC ACID 500-334 MG/5ML PO SOLN
30.0000 mL | ORAL | Status: DC | PRN
Start: 2020-09-24 — End: 2020-09-24

## 2020-09-24 MED ORDER — TERBUTALINE SULFATE 1 MG/ML IJ SOLN: 0.2500 mg | Freq: Once | INTRAMUSCULAR | Status: DC | PRN

## 2020-09-24 MED ORDER — FLEET ENEMA 7-19 GM/118ML RE ENEM: 1.0000 | ENEMA | Freq: Every day | RECTAL | Status: DC | PRN

## 2020-09-24 MED ORDER — ZOLPIDEM TARTRATE 5 MG PO TABS: 5.0000 mg | ORAL_TABLET | Freq: Every evening | ORAL | Status: DC | PRN

## 2020-09-24 MED ORDER — DIPHENHYDRAMINE HCL 25 MG PO CAPS: 25.0000 mg | ORAL_CAPSULE | Freq: Four times a day (QID) | ORAL | Status: DC | PRN

## 2020-09-24 MED ORDER — IBUPROFEN 600 MG PO TABS
600.0000 mg | ORAL_TABLET | Freq: Four times a day (QID) | ORAL | Status: DC
  Administered 2020-09-24 – 2020-09-25 (×2): 600 mg via ORAL
  Filled 2020-09-24 (×4): qty 1

## 2020-09-24 MED ORDER — DIPHENHYDRAMINE HCL 50 MG/ML IJ SOLN: 12.5000 mg | INTRAMUSCULAR | Status: DC | PRN

## 2020-09-24 MED ORDER — COCONUT OIL OIL
1.0000 "application " | TOPICAL_OIL | Status: DC | PRN
  Administered 2020-09-25: 1 via TOPICAL

## 2020-09-24 MED ORDER — OXYTOCIN BOLUS FROM INFUSION
333.0000 mL | Freq: Once | INTRAVENOUS | Status: AC
  Administered 2020-09-24: 333 mL via INTRAVENOUS

## 2020-09-24 MED ORDER — PRENATAL MULTIVITAMIN CH
1.0000 | ORAL_TABLET | Freq: Every day | ORAL | Status: DC
  Filled 2020-09-24: qty 1

## 2020-09-24 NOTE — Progress Notes (Signed)
Patient with increased pelvic pressure, variable decel noted with last contraction. Patient now 8-9/90/0 but very soft cervix. Reposition, anticipate SVD

## 2020-09-24 NOTE — MAU Provider Note (Signed)
Event Date/Time   First Provider Initiated Contact with Patient 09/24/20 [phone]      S Ms. Whitney Wood is a 25 y.o. [phone] pregnant female at [redacted]w[redacted]d who presents to MAU today with complaint of decreased fetal movement along with some increased frequency of contractions (although still somewhat irregular), increased pressure and leaking of fluid. Still endorsing DFM after being put on the monitors. Has a history of late term IUFD, experiencing a great deal of anxiety r/t this.   O BP 127/79   Pulse (!) 118   Temp 97.7 F (36.5 C)   Resp 18   Ht 5\' 5"  (1.651 m)   Wt 195 lb (88.5 kg)   LMP  (Approximate)   SpO2 98%   BMI 32.45 kg/m  Physical Exam Vitals and nursing note reviewed.  Constitutional:      General: She is not in acute distress.    Appearance: Normal appearance. She is not ill-appearing.  Eyes:     Pupils: Pupils are equal, round, and reactive to light.  Cardiovascular:     Rate and Rhythm: Tachycardia present.     Pulses: Normal pulses.  Pulmonary:     Effort: Pulmonary effort is normal.  Skin:    General: Skin is warm and dry.     Capillary Refill: Capillary refill takes less than 2 seconds.  Neurological:     Mental Status: She is alert and oriented to person, place, and time.  Psychiatric:        Mood and Affect: Mood normal.        Behavior: Behavior normal.        Thought Content: Thought content normal.        Judgment: Judgment normal.     Comments: Anxious     Discussed options with pt, who requested strongly to stay due to her DFM and anxiety. BPP ordered.  Called to discuss with Dr. who agreed admission would be best and assumed care.  Admit to L&D - care turned over to Dr Reina Fuse at [address] 09/24/2020 9:29 AM

## 2020-09-24 NOTE — MAU Note (Signed)
Pt reports increased pelvic pressure and ctx off and on since yesterday.  Reprots decreased fetal movment. Deneis ay vag bleeding is having some leaking but unsure if it just urinating on herself. 2cm in office this week

## 2020-09-24 NOTE — Anesthesia Preprocedure Evaluation (Signed)
Anesthesia Evaluation  Patient identified by MRN, date of birth, ID band Patient awake    Reviewed: Allergy & Precautions, H&P , NPO status , Patient's Chart, lab work & pertinent test results  History of Anesthesia Complications Negative for: history of anesthetic complications  Airway Mallampati: II  TM Distance: >3 FB Neck ROM: full    Dental no notable dental hx.    Pulmonary neg pulmonary ROS,    Pulmonary exam normal        Cardiovascular negative cardio ROS Normal cardiovascular exam Rhythm:regular Rate:Normal     Neuro/Psych negative neurological ROS  negative psych ROS   GI/Hepatic negative GI ROS, Neg liver ROS,   Endo/Other  negative endocrine ROS  Renal/GU negative Renal ROS  negative genitourinary   Musculoskeletal   Abdominal   Peds  Hematology  (+) Blood dyscrasia, anemia ,   Anesthesia Other Findings   Reproductive/Obstetrics (+) Pregnancy                             Anesthesia Physical Anesthesia Plan  ASA: II  Anesthesia Plan: Epidural   Post-op Pain Management:    Induction:   PONV Risk Score and Plan:   Airway Management Planned:   Additional Equipment:   Intra-op Plan:   Post-operative Plan:   Informed Consent: I have reviewed the patients History and Physical, chart, labs and discussed the procedure including the risks, benefits and alternatives for the proposed anesthesia with the patient or authorized representative who has indicated his/her understanding and acceptance.       Plan Discussed with:   Anesthesia Plan Comments:         Anesthesia Quick Evaluation  

## 2020-09-24 NOTE — Progress Notes (Signed)
Labor Note  S: s/p epidural, some increased pelvic pressure  O: BP 104/68   Pulse 91   Temp 98 F (36.7 C) (Oral)   Resp 18   Ht 5\' 5"  (1.651 m)   Wt 88.5 kg   LMP  (Approximate)   SpO2 98%   BMI 32.47 kg/m  CE: clear AROM, 3-4/70/-1 FHR: Baseline 125, +accels, -decels, mod variability TOCO q2-47m, pitocin at 22mU/min  A/P: This is a 25 y.o. 22 at [redacted]w[redacted]d  admitted for IOL for DFM with history of 35wk IUFD FWB: cat 1 MWB: s/p epidural Labor course: s/p AROM @ 1640, clear, continue titrate pitocin  Anticipate SVD

## 2020-09-24 NOTE — H&P (Addendum)
Whitney Wood is a 25 y.o. female presenting for decreased fetal movement and increased pelvic pressure since approx AM this morning. CTX irr but becoming stronger, denies VB or LOF. OB History    Gravida  5   Para  2   Term  1   Preterm  1   AB  2   Living  1     SAB  2   IAB  0   Ectopic  0   Multiple  0   Live Births  1          Past Medical History:  Diagnosis Date  . Medical history non-contributory    Past Surgical History:  Procedure Laterality Date  . APPENDECTOMY    . HERNIA REPAIR     Family History: family history includes Hypothyroidism in her mother. Social History:  reports that she has never smoked. She has never used smokeless tobacco. She reports that she does not drink alcohol and does not use drugs.     Maternal Diabetes: No1hr 138, 3hr 0 of 4 Genetic Screening: Declined Maternal Ultrasounds/Referrals: Normal Fetal Ultrasounds or other Referrals:  None Maternal Substance Abuse:  No Significant Maternal Medications:  None Significant Maternal Lab Results:  Group B Strep negative Other Comments:  None  Review of Systems  Constitutional: Negative for chills and fever.  Respiratory: Negative for shortness of breath.   Cardiovascular: Negative for chest pain, palpitations and leg swelling.  Gastrointestinal: Negative for abdominal pain and vomiting.  Neurological: Negative for dizziness, weakness and headaches.  Psychiatric/Behavioral: Negative for suicidal ideas.   Maternal Medical History:  Reason for admission: Decreased FM  Contractions: Onset was 1-2 hours ago.   Frequency: irregular.   Perceived severity is moderate.    Fetal activity: Perceived fetal activity is decreased.   Last perceived fetal movement was within the past 12 hours.    Prenatal complications: No PIH, pre-eclampsia or preterm labor.   Prenatal Complications - Diabetes: none.    Dilation: 2 Effacement (%): 50 Station: Ballotable Exam by::  K.Wilson,RN Blood pressure 127/79, pulse (!) 118, temperature 97.7 F (36.5 C), resp. rate 18, height 5\' 5"  (1.651 m), weight 88.5 kg, SpO2 98 %, unknown if currently breastfeeding. Exam Physical Exam Constitutional:      General: She is not in acute distress.    Appearance: She is well-developed and well-nourished.  HENT:     Head: Normocephalic and atraumatic.  Eyes:     Pupils: Pupils are equal, round, and reactive to light.  Cardiovascular:     Rate and Rhythm: Normal rate and regular rhythm.     Heart sounds: No murmur heard. No gallop.   Abdominal:     Tenderness: There is no abdominal tenderness. There is no guarding or rebound.  Genitourinary:    Vagina: Normal.     Uterus: Normal.   Musculoskeletal:        General: Normal range of motion.     Cervical back: Normal range of motion and neck supple.  Skin:    General: Skin is warm and dry.  Neurological:     Mental Status: She is alert and oriented to person, place, and time.     Cat 1 tracing, TOCO q3-35m  Prenatal labs: ABO, Rh: --/--/O POS (01/27 1800) Antibody: NEG (01/27 1800) Rubella:  imm RPR:   nr HBsAg:   neg HIV:   nr GBS:   neg  Assessment/Plan: This is a 24yo 04-14-2003 by LMP c/w 1st trim scan admitted  for IOL for perceived decreased FM with history of third trimester loss. 35wk loss approx 1 yr ago. H/o early SAB x2 on progesterone and baby ASA. GBS neg, baby girl   Admit to L&D, desires epidural. AROM and pitocin  BPP pending from MAU  Valerie Roys Dejan Angert 09/24/2020, 9:31 AM

## 2020-09-24 NOTE — Progress Notes (Signed)
I checked in on Whitney Wood and Whitney Wood as they wait in MAU.  I met them last year when their daughter was born still. They are anxious but doing okay today.  Please page if needs arise over their time here.  [place], [place] Pager, [phone] 9:34 AM

## 2020-09-24 NOTE — Anesthesia Procedure Notes (Signed)
Epidural Patient location during procedure: OB Start time: 09/24/2020 12:41 PM End time: 09/24/2020 12:51 PM  Staffing Anesthesiologist: Lucretia Kern, MD Performed: anesthesiologist   Preanesthetic Checklist Completed: patient identified, IV checked, risks and benefits discussed, monitors and equipment checked, pre-op evaluation and timeout performed  Epidural Patient position: sitting Prep: DuraPrep Patient monitoring: heart rate, continuous pulse ox and blood pressure Approach: midline Location: L3-L4 Injection technique: LOR air  Needle:  Needle type: Tuohy  Needle gauge: 17 G Needle length: 9 cm Needle insertion depth: 5 cm Catheter type: closed end flexible Catheter size: 19 Gauge Catheter at skin depth: 10 cm Test dose: negative  Assessment Events: blood not aspirated, injection not painful, no injection resistance, no paresthesia and negative IV test  Additional Notes Reason for block:procedure for pain

## 2020-09-25 LAB — CBC
HCT: 28.8 % — ABNORMAL LOW (ref 36.0–46.0)
Hemoglobin: 9.4 g/dL — ABNORMAL LOW (ref 12.0–15.0)
MCH: 25.2 pg — ABNORMAL LOW (ref 26.0–34.0)
MCHC: 32.6 g/dL (ref 30.0–36.0)
MCV: 77.2 fL — ABNORMAL LOW (ref 80.0–100.0)
Platelets: 167 10*3/uL (ref 150–400)
RBC: 3.73 MIL/uL — ABNORMAL LOW (ref 3.87–5.11)
RDW: 14.6 % (ref 11.5–15.5)
WBC: 10 10*3/uL (ref 4.0–10.5)
nRBC: 0 % (ref 0.0–0.2)

## 2020-09-25 LAB — RPR: RPR Ser Ql: NONREACTIVE

## 2020-09-25 NOTE — Progress Notes (Signed)
Post Partum Day 1 Subjective: no complaints, up ad lib, voiding, tolerating PO and lochia mild. She is bonding well with baby - breastfeeding. She would like discharge to home tomorrow.   Objective: Blood pressure 118/83, pulse 83, temperature 98 F (36.7 C), temperature source Oral, resp. rate 18, height 5\' 5"  (1.651 m), weight 88.5 kg, SpO2 99 %, unknown if currently breastfeeding.  Physical Exam:  General: alert, cooperative and no distress Lochia: appropriate Uterine Fundus: firm Incision: n/a DVT Evaluation: No evidence of DVT seen on physical exam. No significant calf/ankle edema.  Recent Labs    09/24/20 1026 09/25/20 0538  HGB 10.3* 9.4*  HCT 31.4* 28.8*    Assessment/Plan: Plan for discharge tomorrow  Routine pp care   LOS: 1 day   Essam Lowdermilk W Josselyn Harkins 09/25/2020, 5:00 PM

## 2020-09-25 NOTE — Discharge Instructions (Signed)
Call office with any concerns [phone] 

## 2020-09-25 NOTE — Anesthesia Postprocedure Evaluation (Signed)
Anesthesia Post Note  Patient: Whitney Wood  Procedure(s) Performed: AN AD HOC LABOR EPIDURAL     Patient location during evaluation: Mother Baby Anesthesia Type: Epidural Level of consciousness: awake and alert Pain management: pain level controlled Vital Signs Assessment: post-procedure vital signs reviewed and stable Respiratory status: spontaneous breathing, nonlabored ventilation and respiratory function stable Cardiovascular status: stable Postop Assessment: no headache, no backache and epidural receding Anesthetic complications: no   No complications documented.  Last Vitals:  Vitals:   09/25/20 0101 09/25/20 0505  BP: 110/61 111/78  Pulse: 84 62  Resp: 17 18  Temp: 37.2 C (!) 36.4 C  SpO2: 98% 98%    Last Pain:  Vitals:   09/25/20 0505  TempSrc: Oral  PainSc: 0-No pain   Pain Goal:                   Acel Natzke

## 2020-09-26 MED ORDER — IBUPROFEN 600 MG PO TABS
600.0000 mg | ORAL_TABLET | Freq: Four times a day (QID) | ORAL | 0 refills | Status: AC
Start: 2020-09-26 — End: ?

## 2020-09-26 NOTE — Discharge Summary (Signed)
Postpartum Discharge Summary       Patient Name: Whitney Wood DOB: 07/12/1996 MRN: [id number]  Date of admission: 09/24/2020 Delivery date:09/24/2020  Delivering provider: Carlisle Wood  Date of discharge: 09/26/2020  Admitting diagnosis: Decreased fetal movement [O36.8190] Intrauterine pregnancy: [redacted]w[redacted]d     Secondary diagnosis:  Active Problems:   Decreased fetal movement  Additional problems: none    Discharge diagnosis: Term Pregnancy Delivered                                              Post partum procedures:none Augmentation: AROM and Pitocin Complications: None  Hospital course: Induction of Labor With Vaginal Delivery   25 y.o. yo [phone] at [redacted]w[redacted]d was admitted to the hospital 09/24/2020 for induction of labor.  Indication for induction: decreased fetal movement with h/o third trimester IUFD.  Patient had an uncomplicated labor course as follows: Membrane Rupture Time/Date: 4:38 PM ,09/24/2020   Delivery Method:Vaginal, Spontaneous  Episiotomy: None  Lacerations:  None  Details of delivery can be found in separate delivery note.  Patient had a routine postpartum course. Patient is discharged home 09/26/20.  Newborn Data: Birth date:09/24/2020  Birth time:5:53 PM  Gender:Female  Living status:Living  Apgars:8 ,9  Weight:3070 g   Magnesium Sulfate received: No BMZ received: No Rhophylac:No  Physical exam  Vitals:   09/25/20 0900 09/25/20 1619 09/25/20 2215 09/26/20 0450  BP: 112/80 118/83 119/76 117/83  Pulse: 85 83 71 79  Resp: 18 18 14 18   Temp: 97.7 F (36.5 C) 98 F (36.7 C) 98.2 F (36.8 C) 98.4 F (36.9 C)  TempSrc: Oral Oral Oral Oral  SpO2: 99% 99% 100% 98%  Weight:      Height:       General: alert and cooperative Lochia: appropriate Uterine Fundus: firm  Labs: Lab Results  Component Value Date   WBC 10.0 09/25/2020   HGB 9.4 (L) 09/25/2020   HCT 28.8 (L) 09/25/2020   MCV 77.2 (L) 09/25/2020   PLT 167 09/25/2020   CMP Latest  Ref Rng & Units 09/14/2020  Glucose 70 - 99 mg/dL 80  BUN 6 - 20 mg/dL 11/12/2020)  Creatinine <1(U - 1.00 mg/dL 9.32)  Sodium 3.55(D - 322 mmol/L 132(L)  Potassium 3.5 - 5.1 mmol/L 3.5  Chloride 98 - 111 mmol/L 103  CO2 22 - 32 mmol/L 17(L)  Calcium 8.9 - 10.3 mg/dL 025)  Total Protein 6.5 - 8.1 g/dL 6.3(L)  Total Bilirubin 0.3 - 1.2 mg/dL 0.7  Alkaline Phos 38 - 126 U/L 95  AST 15 - 41 U/L 14(L)  ALT 0 - 44 U/L 11   Edinburgh Score: Edinburgh Postnatal Depression Scale Screening Tool 09/24/2020  I have been able to laugh and see the funny side of things. 0  I have looked forward with enjoyment to things. 0  I have blamed myself unnecessarily when things went wrong. 0  I have been anxious or worried for no good reason. 0  I have felt scared or panicky for no good reason. 1  Things have been getting on top of me. 1  I have been so unhappy that I have had difficulty sleeping. 1  I have felt sad or miserable. 1  I have been so unhappy that I have been crying. 0  The thought of harming myself has occurred to me. 0  09/26/2020  Postnatal Depression Scale Total 4     After visit meds:  Allergies as of 09/26/2020   No Known Allergies     Medication List    STOP taking these medications   ondansetron 8 MG disintegrating tablet Commonly known as: Zofran ODT     TAKE these medications   acetaminophen 500 MG tablet Commonly known as: TYLENOL Take 1,000 mg by mouth every 6 (six) hours as needed.   fluticasone 50 MCG/ACT nasal spray Commonly known as: Flonase Place 2 sprays into both nostrils in the morning and at bedtime for 7 days.   ibuprofen 600 MG tablet Commonly known as: ADVIL Take 1 tablet (600 mg total) by mouth every 6 (six) hours.   prenatal multivitamin Tabs tablet Take 1 tablet by mouth daily at 12 noon.        Discharge home in stable condition Infant Feeding: Breast Infant Disposition:home with mother Discharge instruction: per After Visit Summary and  Postpartum booklet. Activity: Advance as tolerated. Pelvic rest for 6 weeks.  Diet: routine diet Future Appointments:No future appointments. Follow up Visit:  Follow-up Information    Whitney Wood, Whitney Roys, MD. Schedule an appointment as soon as possible for a visit in 6 week(s).   Specialty: Obstetrics and Gynecology Why: For postpartum visit Contact information: [address] [place] [address] [place] [address] [phone]                Please schedule this patient for a In person postpartum visit in 6 weeks with the following provider: MD. Delivery mode:  Vaginal, Spontaneous  Anticipated Birth Control:  POPs   09/26/2020 Whitney Pila, MD

## 2020-09-26 NOTE — Progress Notes (Signed)
Post Partum Day 2 Subjective: no complaints, up ad lib and tolerating PO  Baby eating well  Objective: Blood pressure 117/83, pulse 79, temperature 98.4 F (36.9 C), temperature source Oral, resp. rate 18, height 5\' 5"  (1.651 m), weight 88.5 kg, SpO2 98 %, unknown if currently breastfeeding.  Physical Exam:  General: alert and cooperative Lochia: appropriate Uterine Fundus: firm   Recent Labs    09/24/20 1026 09/25/20 0538  HGB 10.3* 9.4*  HCT 31.4* 28.8*    Assessment/Plan: Discharge home   LOS: 2 days   09/27/20 09/26/2020, 9:41 AM

## 2020-09-26 NOTE — Plan of Care (Signed)
  Problem: Life Cycle: Goal: Chance of risk for complications during the postpartum period will decrease Outcome: Adequate for Discharge   Problem: Role Relationship: Goal: Ability to demonstrate positive interaction with newborn will improve Outcome: Adequate for Discharge

## 2020-10-05 ENCOUNTER — Other Ambulatory Visit (HOSPITAL_COMMUNITY): Payer: 59

## 2020-10-07 ENCOUNTER — Inpatient Hospital Stay (HOSPITAL_COMMUNITY): Admission: AD | Admit: 2020-10-07 | Payer: 59 | Source: Home / Self Care | Admitting: Obstetrics and Gynecology

## 2020-10-07 ENCOUNTER — Encounter (HOSPITAL_COMMUNITY): Payer: 59

## 2020-10-07 ENCOUNTER — Inpatient Hospital Stay (HOSPITAL_COMMUNITY): Payer: 59

## 2022-01-23 IMAGING — US US MFM FETAL BPP W/O NON-STRESS
1 series · 15 of 28 positions shown · non-contrast
Comparison: none

[Series 1: us mfm fetal bpp w/o non-stress · 33 acquisitions, 15 frames shown]
[im 1/33]
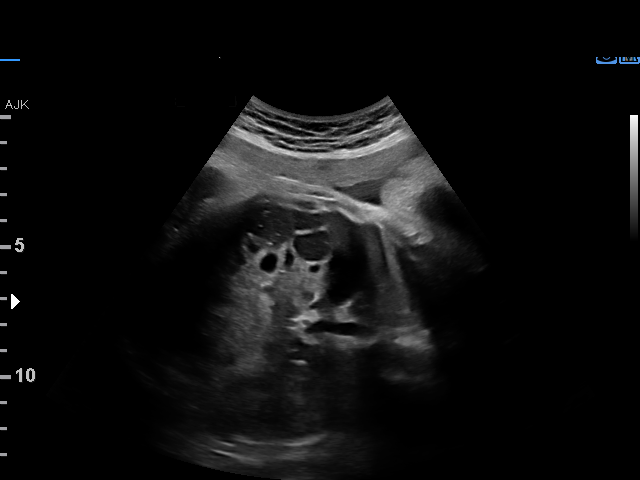
[im 3/33]
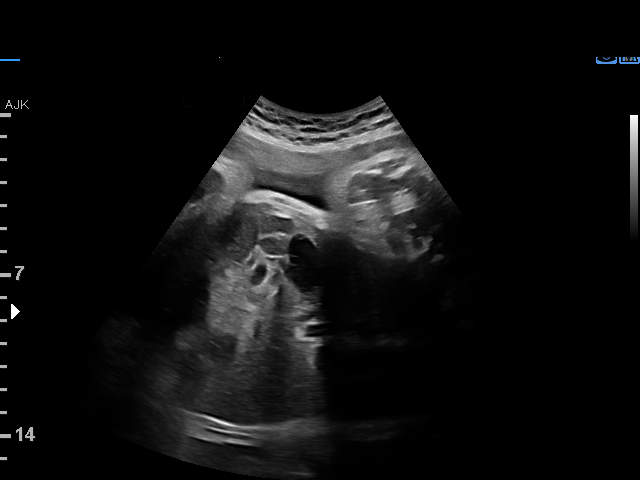
[im 5/33]
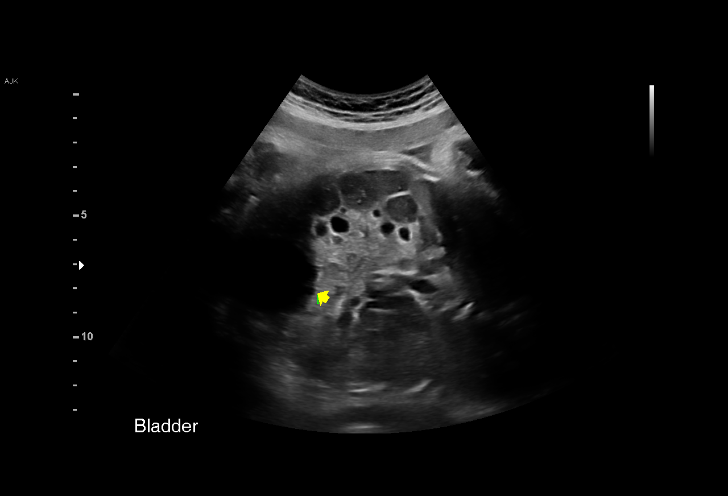
[im 8/33]
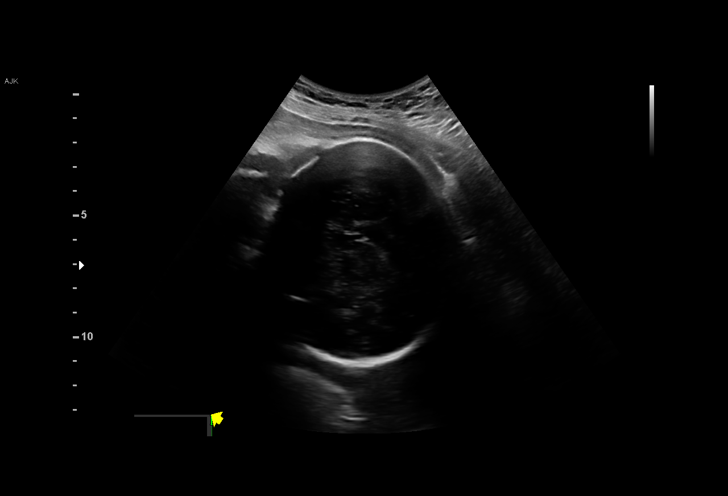
[im 10/33]
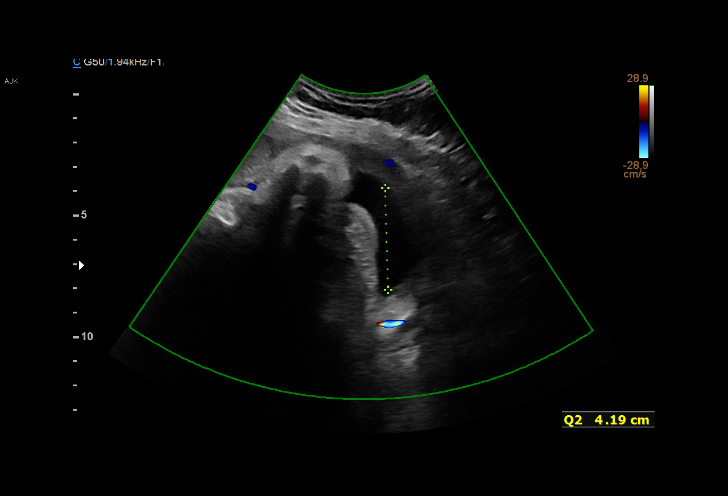
[im 12/33]
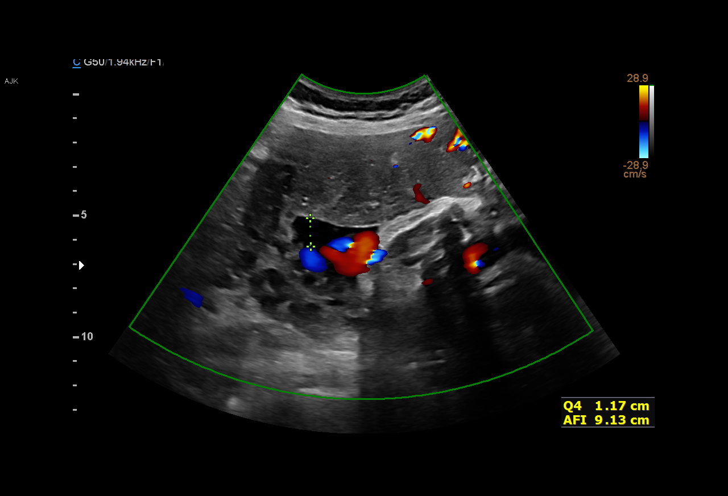
[im 15/33]
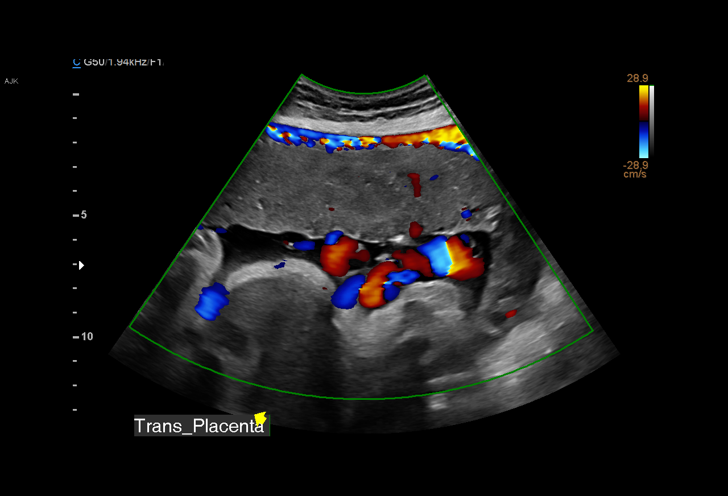
[im 17/33]
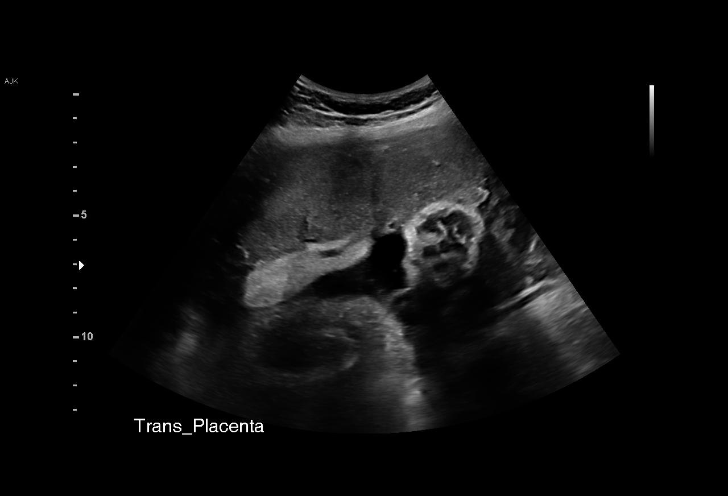
[im 18/33]
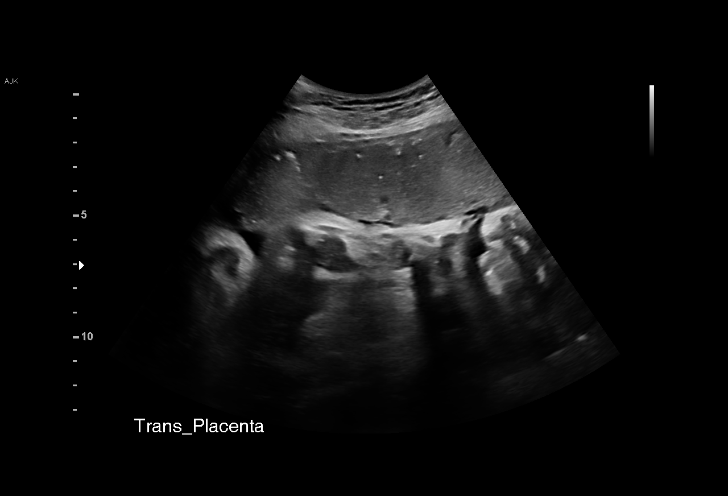
[im 21/33]
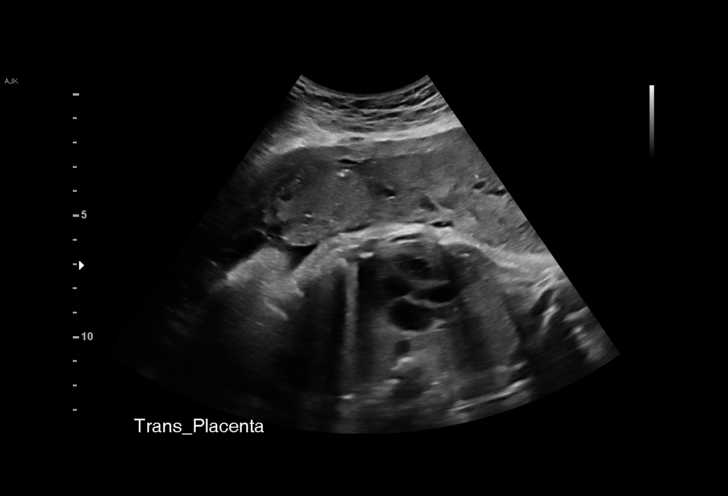
[im 23/33]
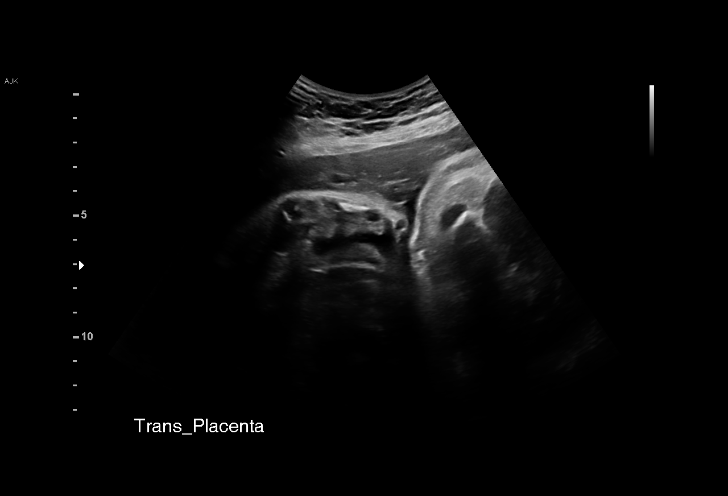
[im 25/33]
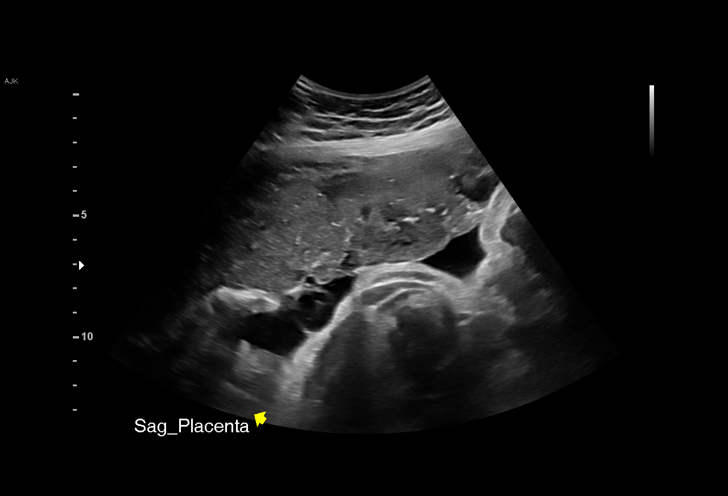
[im 28/33]
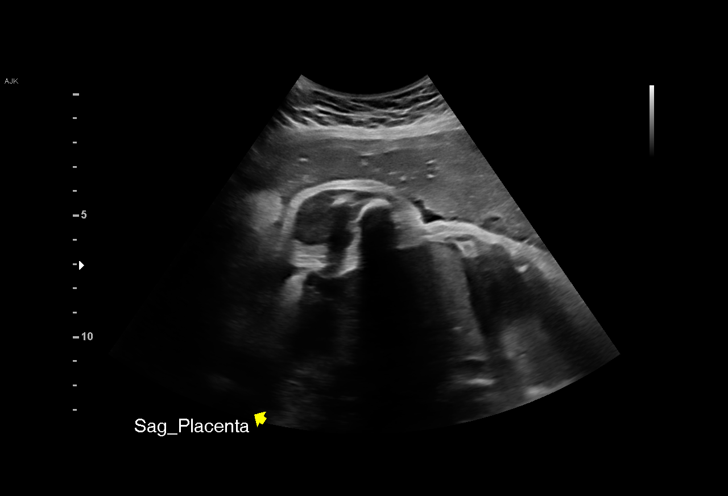
[im 30/33]
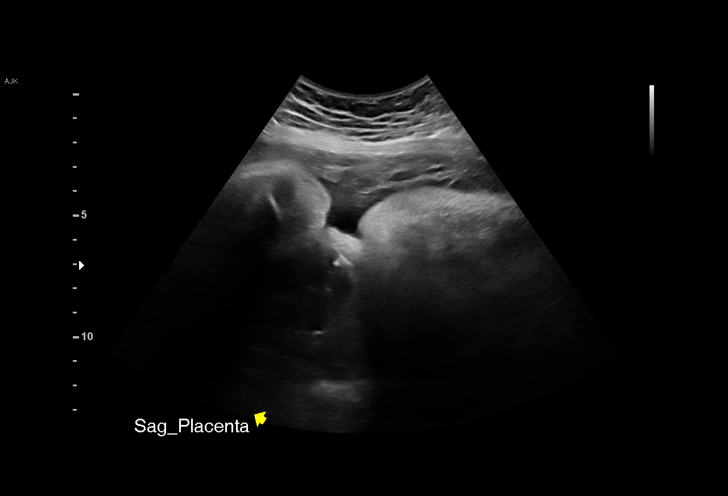
[im 33/33]
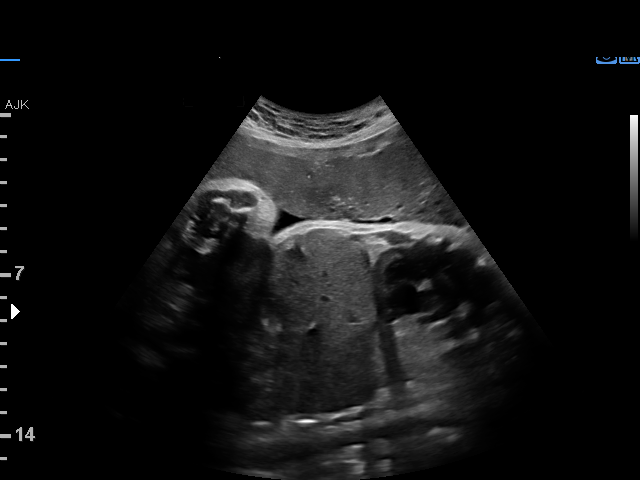

[15 of 28 positions shown; findings below may reference images not displayed]

Indications

 37 weeks gestation of pregnancy
 Decreased fetal movement
 Poor obstetric history: Previous IUFD
 (stillbirth)
Vital Signs

 BMI:
Fetal Evaluation

 Num Of Fetuses:         1
 Fetal Heart Rate(bpm):  141
 Cardiac Activity:       Observed
 Presentation:           Cephalic
 Placenta:               Anterior
 P. Cord Insertion:      Visualized, central

 Amniotic Fluid
 AFI FV:      Within normal limits

 AFI Sum(cm)     %Tile       Largest Pocket(cm)
 9.2             18

 RUQ(cm)       RLQ(cm)       LUQ(cm)        LLQ(cm)

Biophysical Evaluation

 Amniotic F.V:   Within normal limits       F. Tone:        Observed
 F. Movement:    Observed                   Score:          [DATE]
 F. Breathing:   Observed
OB History

 Gravidity:    5         Term:   1        Prem:   1        SAB:   2
Gestational Age

 Clinical EDD:  37w 3d                                        EDD:   10/12/20
 Best:          37w 3d     Det. By:  Clinical EDD             EDD:   10/12/20
Impression

 Patient was evaluated for c/o decreased fetal movements .
 Amniotic fluid is normal and good fetal activity is seen
 .Antenatal testing is reassuring. BPP [DATE]. Cephalic
 presentation.
                 [place], Shyed
